# Patient Record
Sex: Female | Born: 1955 | Race: White | Hispanic: No | Smoking: Never smoker
Health system: Southern US, Community
[De-identification: ages and names within clinical notes are randomized; demographics above are authoritative.]

## PROBLEM LIST (undated history)

## (undated) DIAGNOSIS — E119 Type 2 diabetes mellitus without complications: Secondary | ICD-10-CM

## (undated) DIAGNOSIS — R011 Cardiac murmur, unspecified: Secondary | ICD-10-CM

## (undated) DIAGNOSIS — D492 Neoplasm of unspecified behavior of bone, soft tissue, and skin: Secondary | ICD-10-CM

## (undated) DIAGNOSIS — H269 Unspecified cataract: Secondary | ICD-10-CM

## (undated) DIAGNOSIS — F329 Major depressive disorder, single episode, unspecified: Secondary | ICD-10-CM

## (undated) DIAGNOSIS — R569 Unspecified convulsions: Secondary | ICD-10-CM

## (undated) DIAGNOSIS — M940 Chondrocostal junction syndrome [Tietze]: Secondary | ICD-10-CM

## (undated) DIAGNOSIS — I2699 Other pulmonary embolism without acute cor pulmonale: Secondary | ICD-10-CM

## (undated) HISTORY — DX: Other pulmonary embolism without acute cor pulmonale: I26.99

## (undated) HISTORY — DX: Type 2 diabetes mellitus without complications: E11.9

## (undated) HISTORY — DX: Unspecified convulsions: R56.9

## (undated) HISTORY — DX: Major depressive disorder, single episode, unspecified: F32.9

## (undated) HISTORY — DX: Cardiac murmur, unspecified: R01.1

## (undated) HISTORY — PX: OTHER SURGICAL HISTORY: SHX169

## (undated) HISTORY — DX: Unspecified cataract: H26.9

## (undated) HISTORY — DX: Neoplasm of unspecified behavior of bone, soft tissue, and skin: D49.2

## (undated) HISTORY — DX: Chondrocostal junction syndrome (tietze): M94.0

---

## 1990-01-09 DIAGNOSIS — R569 Unspecified convulsions: Secondary | ICD-10-CM

## 1990-01-09 HISTORY — DX: Unspecified convulsions: R56.9

## 2001-01-09 HISTORY — PX: OTHER SURGICAL HISTORY: SHX169

## 2001-05-08 ENCOUNTER — Encounter: Payer: Self-pay | Admitting: Neurosurgery

## 2001-05-08 ENCOUNTER — Ambulatory Visit (HOSPITAL_COMMUNITY): Admission: RE | Admit: 2001-05-08 | Discharge: 2001-05-08 | Payer: Self-pay | Admitting: Neurosurgery

## 2001-05-09 ENCOUNTER — Encounter: Payer: Self-pay | Admitting: Neurosurgery

## 2001-05-09 ENCOUNTER — Inpatient Hospital Stay (HOSPITAL_COMMUNITY): Admission: RE | Admit: 2001-05-09 | Discharge: 2001-05-16 | Payer: Self-pay | Admitting: Neurosurgery

## 2001-05-09 ENCOUNTER — Encounter (INDEPENDENT_AMBULATORY_CARE_PROVIDER_SITE_OTHER): Payer: Self-pay | Admitting: Specialist

## 2001-05-13 ENCOUNTER — Encounter: Payer: Self-pay | Admitting: Neurosurgery

## 2001-05-30 ENCOUNTER — Encounter: Payer: Self-pay | Admitting: Neurosurgery

## 2001-05-30 ENCOUNTER — Encounter: Admission: RE | Admit: 2001-05-30 | Discharge: 2001-05-30 | Payer: Self-pay | Admitting: Neurosurgery

## 2001-09-26 ENCOUNTER — Encounter: Admission: RE | Admit: 2001-09-26 | Discharge: 2001-09-26 | Payer: Self-pay | Admitting: Neurosurgery

## 2001-09-26 ENCOUNTER — Encounter: Payer: Self-pay | Admitting: Neurosurgery

## 2001-11-24 ENCOUNTER — Ambulatory Visit (HOSPITAL_COMMUNITY): Admission: RE | Admit: 2001-11-24 | Discharge: 2001-11-24 | Payer: Self-pay | Admitting: Neurosurgery

## 2001-11-24 ENCOUNTER — Encounter: Payer: Self-pay | Admitting: Neurosurgery

## 2001-12-09 ENCOUNTER — Encounter: Admission: RE | Admit: 2001-12-09 | Discharge: 2001-12-09 | Payer: Self-pay | Admitting: Neurosurgery

## 2001-12-09 ENCOUNTER — Encounter: Payer: Self-pay | Admitting: Neurosurgery

## 2002-02-10 ENCOUNTER — Encounter: Admission: RE | Admit: 2002-02-10 | Discharge: 2002-02-10 | Payer: Self-pay | Admitting: Neurosurgery

## 2002-02-10 ENCOUNTER — Encounter: Payer: Self-pay | Admitting: Neurosurgery

## 2002-04-11 ENCOUNTER — Encounter: Payer: Self-pay | Admitting: Neurosurgery

## 2002-04-11 ENCOUNTER — Inpatient Hospital Stay (HOSPITAL_COMMUNITY): Admission: RE | Admit: 2002-04-11 | Discharge: 2002-04-12 | Payer: Self-pay | Admitting: Neurosurgery

## 2004-10-06 ENCOUNTER — Ambulatory Visit: Payer: Self-pay | Admitting: Internal Medicine

## 2004-10-11 ENCOUNTER — Other Ambulatory Visit: Admission: RE | Admit: 2004-10-11 | Discharge: 2004-10-11 | Payer: Self-pay | Admitting: Internal Medicine

## 2004-10-11 ENCOUNTER — Encounter: Payer: Self-pay | Admitting: Internal Medicine

## 2004-10-11 ENCOUNTER — Ambulatory Visit: Payer: Self-pay | Admitting: Internal Medicine

## 2004-10-24 ENCOUNTER — Ambulatory Visit (HOSPITAL_COMMUNITY): Admission: RE | Admit: 2004-10-24 | Discharge: 2004-10-24 | Payer: Self-pay | Admitting: Internal Medicine

## 2004-12-15 ENCOUNTER — Inpatient Hospital Stay (HOSPITAL_COMMUNITY): Admission: RE | Admit: 2004-12-15 | Discharge: 2004-12-17 | Payer: Self-pay | Admitting: Obstetrics and Gynecology

## 2004-12-15 ENCOUNTER — Encounter (INDEPENDENT_AMBULATORY_CARE_PROVIDER_SITE_OTHER): Payer: Self-pay | Admitting: Specialist

## 2005-01-09 DIAGNOSIS — I2699 Other pulmonary embolism without acute cor pulmonale: Secondary | ICD-10-CM

## 2005-01-09 HISTORY — PX: ABDOMINAL HYSTERECTOMY: SHX81

## 2005-01-09 HISTORY — DX: Other pulmonary embolism without acute cor pulmonale: I26.99

## 2005-11-27 ENCOUNTER — Ambulatory Visit: Payer: Self-pay | Admitting: Cardiology

## 2005-11-27 ENCOUNTER — Ambulatory Visit: Payer: Self-pay | Admitting: Internal Medicine

## 2005-11-27 ENCOUNTER — Inpatient Hospital Stay (HOSPITAL_COMMUNITY): Admission: AD | Admit: 2005-11-27 | Discharge: 2005-12-02 | Payer: Self-pay | Admitting: Cardiology

## 2005-11-28 ENCOUNTER — Encounter: Payer: Self-pay | Admitting: Internal Medicine

## 2005-11-29 ENCOUNTER — Encounter: Payer: Self-pay | Admitting: Vascular Surgery

## 2005-12-04 ENCOUNTER — Ambulatory Visit: Payer: Self-pay | Admitting: Internal Medicine

## 2005-12-04 ENCOUNTER — Ambulatory Visit: Payer: Self-pay

## 2005-12-04 ENCOUNTER — Ambulatory Visit: Payer: Self-pay | Admitting: Cardiology

## 2005-12-06 ENCOUNTER — Ambulatory Visit: Payer: Self-pay | Admitting: Cardiovascular Disease

## 2005-12-14 ENCOUNTER — Ambulatory Visit: Payer: Self-pay | Admitting: Cardiology

## 2005-12-22 ENCOUNTER — Ambulatory Visit: Payer: Self-pay | Admitting: Internal Medicine

## 2005-12-26 ENCOUNTER — Ambulatory Visit: Payer: Self-pay | Admitting: Cardiology

## 2006-01-12 ENCOUNTER — Ambulatory Visit: Payer: Self-pay | Admitting: Cardiology

## 2006-02-09 ENCOUNTER — Ambulatory Visit: Payer: Self-pay | Admitting: Internal Medicine

## 2006-03-09 ENCOUNTER — Ambulatory Visit: Payer: Self-pay | Admitting: Internal Medicine

## 2006-04-06 ENCOUNTER — Ambulatory Visit: Payer: Self-pay | Admitting: Cardiology

## 2006-05-04 ENCOUNTER — Ambulatory Visit: Payer: Self-pay | Admitting: Cardiology

## 2006-08-09 ENCOUNTER — Encounter: Admission: RE | Admit: 2006-08-09 | Discharge: 2006-08-09 | Payer: Self-pay | Admitting: Internal Medicine

## 2006-08-09 LAB — HM MAMMOGRAPHY: HM Mammogram: NORMAL

## 2006-11-22 ENCOUNTER — Telehealth: Payer: Self-pay | Admitting: Internal Medicine

## 2006-11-22 ENCOUNTER — Ambulatory Visit: Payer: Self-pay | Admitting: Internal Medicine

## 2006-11-22 DIAGNOSIS — R079 Chest pain, unspecified: Secondary | ICD-10-CM | POA: Insufficient documentation

## 2006-11-23 LAB — CONVERTED CEMR LAB
Basophils Absolute: 0 10*3/uL (ref 0.0–0.1)
Basophils Relative: 0 % (ref 0–1)
Eosinophils Relative: 0 % (ref 0–5)
Hemoglobin: 13.7 g/dL (ref 12.0–15.0)
Platelets: 258 10*3/uL (ref 150–400)
RBC: 4.43 M/uL (ref 3.87–5.11)
RDW: 13.6 % (ref 11.5–15.5)

## 2007-04-06 ENCOUNTER — Encounter: Payer: Self-pay | Admitting: *Deleted

## 2007-04-06 DIAGNOSIS — Z8659 Personal history of other mental and behavioral disorders: Secondary | ICD-10-CM | POA: Insufficient documentation

## 2007-04-06 DIAGNOSIS — Z8669 Personal history of other diseases of the nervous system and sense organs: Secondary | ICD-10-CM | POA: Insufficient documentation

## 2007-04-06 DIAGNOSIS — M940 Chondrocostal junction syndrome [Tietze]: Secondary | ICD-10-CM | POA: Insufficient documentation

## 2007-12-19 ENCOUNTER — Ambulatory Visit: Payer: Self-pay | Admitting: Internal Medicine

## 2007-12-19 DIAGNOSIS — M542 Cervicalgia: Secondary | ICD-10-CM | POA: Insufficient documentation

## 2007-12-19 DIAGNOSIS — R5381 Other malaise: Secondary | ICD-10-CM | POA: Insufficient documentation

## 2007-12-19 DIAGNOSIS — R5383 Other fatigue: Secondary | ICD-10-CM

## 2007-12-19 LAB — CONVERTED CEMR LAB
Basophils Relative: 2.3 % (ref 0.0–3.0)
Creatinine, Ser: 0.7 mg/dL (ref 0.4–1.2)
Eosinophils Absolute: 0.1 10*3/uL (ref 0.0–0.7)
Eosinophils Relative: 1.9 % (ref 0.0–5.0)
GFR calc Af Amer: 113 mL/min
GFR calc non Af Amer: 93 mL/min
Glucose, Bld: 100 mg/dL — ABNORMAL HIGH (ref 70–99)
Hemoglobin: 14.8 g/dL (ref 12.0–15.0)
Lymphocytes Relative: 28.6 % (ref 12.0–46.0)
MCHC: 35.3 g/dL (ref 30.0–36.0)
MCV: 89.6 fL (ref 78.0–100.0)
Monocytes Absolute: 0.5 10*3/uL (ref 0.1–1.0)
Monocytes Relative: 6.7 % (ref 3.0–12.0)
Sodium: 140 meq/L (ref 135–145)
Total CK: 96 units/L (ref 7–177)
WBC: 6.9 10*3/uL (ref 4.5–10.5)

## 2007-12-21 ENCOUNTER — Encounter: Payer: Self-pay | Admitting: Internal Medicine

## 2008-04-16 ENCOUNTER — Telehealth: Payer: Self-pay | Admitting: Internal Medicine

## 2008-04-21 ENCOUNTER — Ambulatory Visit: Payer: Self-pay | Admitting: Internal Medicine

## 2008-04-22 ENCOUNTER — Encounter: Payer: Self-pay | Admitting: Internal Medicine

## 2008-04-22 ENCOUNTER — Telehealth: Payer: Self-pay | Admitting: Internal Medicine

## 2008-06-01 ENCOUNTER — Telehealth: Payer: Self-pay | Admitting: Internal Medicine

## 2008-06-21 ENCOUNTER — Telehealth: Payer: Self-pay | Admitting: Family Medicine

## 2008-06-22 ENCOUNTER — Ambulatory Visit: Payer: Self-pay | Admitting: Internal Medicine

## 2008-06-22 ENCOUNTER — Telehealth: Payer: Self-pay | Admitting: Internal Medicine

## 2008-06-24 ENCOUNTER — Telehealth: Payer: Self-pay | Admitting: Internal Medicine

## 2008-06-25 ENCOUNTER — Encounter: Payer: Self-pay | Admitting: Internal Medicine

## 2008-11-25 ENCOUNTER — Telehealth (INDEPENDENT_AMBULATORY_CARE_PROVIDER_SITE_OTHER): Payer: Self-pay | Admitting: *Deleted

## 2008-11-26 ENCOUNTER — Encounter: Payer: Self-pay | Admitting: Internal Medicine

## 2009-05-16 ENCOUNTER — Emergency Department (HOSPITAL_COMMUNITY): Admission: EM | Admit: 2009-05-16 | Discharge: 2009-05-16 | Payer: Self-pay | Admitting: Emergency Medicine

## 2009-05-17 ENCOUNTER — Ambulatory Visit: Payer: Self-pay | Admitting: Internal Medicine

## 2009-05-17 ENCOUNTER — Telehealth: Payer: Self-pay | Admitting: Internal Medicine

## 2009-05-18 ENCOUNTER — Telehealth: Payer: Self-pay | Admitting: Internal Medicine

## 2010-01-11 ENCOUNTER — Ambulatory Visit
Admission: RE | Admit: 2010-01-11 | Discharge: 2010-01-11 | Payer: Self-pay | Source: Home / Self Care | Attending: Internal Medicine | Admitting: Internal Medicine

## 2010-01-11 ENCOUNTER — Telehealth: Payer: Self-pay | Admitting: Internal Medicine

## 2010-01-29 ENCOUNTER — Encounter: Payer: Self-pay | Admitting: Neurosurgery

## 2010-02-08 NOTE — Progress Notes (Signed)
Summary: REFILLs  Phone Note Refill Request Call back at Home Phone [phone]   Refills Requested: Medication #1:  OXYCODONE-ACETAMINOPHEN 5-325 MG TABS 1 to 2 tabs q 6 hours as needed  Medication #2:  NAPROSYN 500 MG TABS 1 tab by mouth two times a day as needed  Medication #3:  DIAZEPAM 5 MG TABS 1 tab by mouth q 6 hours as needed Initial call taken by: Lamar Sprinkles, CMA,  May 18, 2009 9:12 AM  Follow-up for Phone Call        [facility] fo refills 1. oxycodone/APAP 5/325, 1 or 2 q 6 as needed back pain- 60, no refills 2. Diazepam 2mg  - 1-3 tabs q 6 as needed muscle spasm. 60, no refills 3.  May use otc naproxen sodium 220mg  2 tabs two times a day.  Follow-up by: Jacques Navy MD,  May 18, 2009 5:23 PM    New/Updated Medications: DIAZEPAM 2 MG TABS (DIAZEPAM) 1 to 3 q 6 hours as needed muscle spasm Prescriptions: DIAZEPAM 2 MG TABS (DIAZEPAM) 1 to 3 q 6 hours as needed muscle spasm  #60 x 0   Entered by:   Lamar Sprinkles, CMA   Authorized by:   Jacques Navy MD   Signed by:   Lamar Sprinkles, CMA on 05/18/2009   Method used:   Telephoned to ...       [facility] # [address]* (retail)       [address]       [place], [place]  [address]       Ph: [phone]       Fax: [phone]   RxID:   [id number] OXYCODONE-ACETAMINOPHEN 5-325 MG TABS (OXYCODONE-ACETAMINOPHEN) 1 to 2 tabs q 6 hours as needed  #60 x 0   Entered by:   Lamar Sprinkles, CMA   Authorized by:   Jacques Navy MD   Signed by:   Lamar Sprinkles, CMA on 05/18/2009   Method used:   Print then Give to Patient   RxID:   [id number]

## 2010-02-08 NOTE — Assessment & Plan Note (Signed)
Summary: unbearable pain in lower back/post er/lb   Vital Signs:  Patient profile:   55 year old female Height:      65 inches Weight:      175 pounds BMI:     29.23 O2 Sat:      95 % on Room air Pulse rate:   87 / minute BP sitting:   136 / 88  (left arm) Cuff size:   regular  Vitals Entered By: Bill Salinas CMA (May 17, 2009 11:59 AM)  O2 Flow:  Room air CC: pt here for evaluation of lower back pain/ went over pt's allergies and she states that she is not allergic to percocet/ ab   Primary Care Provider:  Deanne Bedgood  CC:  pt here for evaluation of lower back pain/ went over pt's allergies and she states that she is not allergic to percocet/ ab.  History of Present Illness: Patient presents for follow-up of low back pain. This began Thursday, May 5th. she was seen in [facility] May 8th for this pain. All ED records reviewed: she had a normal exam; normal L-S spine films; normal U/A. She was sent home on percocet 5/325 1-2 q 6; Valium 5mg  q 6 and naprosyn 500mg  q 6. She reports that the pain is worse. She denies any loss bladder or bowel function over the past 5 days; no paresthesia in the legs but endorses weakness. She rates the pain as a 7-10/10 that is only relieved for 2 hrs with the pain medication. She has not been taking the naprosyn due to fear of gastric irritation.   Current Medications (verified): 1)  Oxycodone-Acetaminophen 5-325 Mg Tabs (Oxycodone-Acetaminophen) .Marland Kitchen.. 1 To 2 Tabs Q 6 Hours As Needed 2)  Naprosyn 500 Mg Tabs (Naproxen) .Marland Kitchen.. 1 Tab By Mouth Two Times A Day As Needed 3)  Diazepam 5 Mg Tabs (Diazepam) .Marland Kitchen.. 1 Tab By Mouth Q 6 Hours As Needed 4)  Tylenol 325 Mg Tabs (Acetaminophen) .... As Needed  Allergies: 1)  ! Percocet 2)  ! Cortisone  Past History:  Past Medical History: Last updated: 09-Jan-2008 SEIZURES, HX OF (ICD-V12.49) DEPRESSION, SITUATIONAL, HX OF (ICD-V11.8) HEART MURMUR, HX OF BIRTH DEFECT (ICD-V12.50) * Hx of SURGICAL REPAIR FOR DISPLACED ROD  FIXATION '03 * Hx of REMOVAL OF BENIGN TUMOR CERVICAL SPINE '03 CAESAREAN SECTION, HX OF (ICD-V39.01) '82 COSTOCHONDRITIS (ICD-733.6) PULMONARY EMBOLISM SMALL (ICD-415.19) '07 CHEST PAIN (ICD-786.50)-negative eval by cath Nov '07      Past Surgical History: Last updated: 01-09-08 * Hx of SURGICAL REPAIR FOR DISPLACED ROD FIXATION * Hx of REMOVAL OF BENIGN TUMOR CERVICAL SPINE CAESAREAN SECTION, HX OF (ICD-V39.01) Hysterectomy '07  Family History: Last updated: 09-Jan-2008 father - deceased @ 31: CVA, HTN, lipid, CAD, DM mother - 22: excellent health Neg-  PAunt - breast cancer, colon cancer Brother deceased @ 90: MI Brother deceased @ 27: DM with severe complications including renal failure, amputation  Social History: Last updated: 09-Jan-2008 Rachelle Hora - [facility] married '77 2 daughters - '82, '85; 1 son - '91 work: Physicist, medical.  Risk Factors: Caffeine Use: 1 (Jan 09, 2008) Exercise: no (01/09/08)  Risk Factors: Smoking Status: never (04/06/2007)  Review of Systems       The patient complains of muscle weakness.  The patient denies anorexia, fever, weight loss, weight gain, hoarseness, chest pain, dyspnea on exertion, peripheral edema, headaches, abdominal pain, hematochezia, suspicious skin lesions, transient blindness, depression, unusual weight change, enlarged lymph nodes, and breast masses.    Physical Exam  General:  overweight white female who is uncomfortable but not in distress Head:  normocephalic and atraumatic.   Eyes:  corneas and lenses clear and no injection.   Lungs:  normal respiratory effort and normal breath sounds.   Heart:  normal rate and regular rhythm.   Msk:  back exam: nl stand, flex to 40 degrees before limited by pain, nl gait, nl toe/heel walk, able to step-up to exam table without assistance, nl SLR sitting, SLR limited to 30 degrees supine, nl DTR's patellar tendon, tender to percussion at lower lumbar spine with paravertebral  tenderness to palpation. Pulses:  2+ radial Neurologic:  alert & oriented X3 and cranial nerves II-XII intact.   Skin:  turgor normal and color normal.   Psych:  Oriented X3, memory intact for recent and remote, and normally interactive.     Impression & Recommendations:  Problem # 1:  LOW BACK PAIN, ACUTE (ICD-724.2) atient with low back pain. ED evaluation negative. Exam with no radicular signs but low back pain with limitation in movement. Suspect low back strain.  Plan - continue with oxycodone/APAP, Diazepam and Naprosyn           use of heat           out of work until back pain is better           for increased pain or radicular symptoms wil move ahead with MRI.  Her updated medication list for this problem includes:    Oxycodone-acetaminophen 5-325 Mg Tabs (Oxycodone-acetaminophen) .Marland Kitchen... 1 to 2 tabs q 6 hours as needed    Naprosyn 500 Mg Tabs (Naproxen) .Marland Kitchen... 1 tab by mouth two times a day as needed    Tylenol 325 Mg Tabs (Acetaminophen) .Marland Kitchen... As needed  Complete Medication List: 1)  Oxycodone-acetaminophen 5-325 Mg Tabs (Oxycodone-acetaminophen) .Marland Kitchen.. 1 to 2 tabs q 6 hours as needed 2)  Naprosyn 500 Mg Tabs (Naproxen) .Marland Kitchen.. 1 tab by mouth two times a day as needed 3)  Diazepam 5 Mg Tabs (Diazepam) .Marland Kitchen.. 1 tab by mouth q 6 hours as needed 4)  Tylenol 325 Mg Tabs (Acetaminophen) .... As needed

## 2010-02-08 NOTE — Progress Notes (Signed)
Summary: Call Report  Phone Note Other Incoming   Caller: [facility] Call Report Summary of Call: [facility] Triage Call Report Triage Record Num: [id number] Operator: Arline Asp Loftin Patient Name: Tara [name] Call Date & Time: 05/16/2009 8:23:54AM Patient Phone: [phone] PCP: Illene Regulus Patient Gender: Female PCP Fax : [phone] Patient DOB: 01-18-1955 Practice Name: [facility] Reason for Call: Pt with twinge/pain onset 05/05 to back and hips. Pain has progressively worsened, and is now "shooting into my legs." Difficulty standing upright, and c/o weakness to both legs. Advised to be seen for immediate evaluation, and recommended [facility]. Protocol(s) Used: Back Symptoms Recommended Outcome per Protocol: See ED Immediately Reason for Outcome: New onset back pain associated with numbness or weakness in both legs Care Advice:  ~ Do not give the patient anything to eat or drink. 05/16/2009 8:34:33AM Page 1 of 1 [id number] Initial call taken by: Denishia Pyle, CMA,  May 17, 2009 9:07 AM

## 2010-02-10 NOTE — Progress Notes (Signed)
  Phone Note Call from Patient Call back at Home Phone [phone]   Caller: Patient Call For: Jacques Navy MD Summary of Call: Pt has appt at 9:30am today. Pt has very sore throat,cough.mucous. Pt states if Dr will call in meds she will cancel appt. Please advise. Initial call taken by: Verdell Face,  January 11, 2010 8:14 AM  Follow-up for Phone Call        pt here for appt Follow-up by: Ami Bullins CMA,  January 11, 2010 9:29 AM

## 2010-02-10 NOTE — Assessment & Plan Note (Signed)
Summary: very sore throat/cough/mucous/cd   Vital Signs:  Patient profile:   55 year old female Height:      65 inches Weight:      175 pounds BMI:     29.23 O2 Sat:      96 % on Room air Temp:     97.8 degrees F oral Pulse rate:   76 / minute BP sitting:   140 / 90  (left arm) Cuff size:   regular  Vitals Entered By: Bill Salinas CMA (January 11, 2010 9:36 AM)  O2 Flow:  Room air CC: pt here with c/o sore throat x 4 days. she also states that she has been coughing up green mucous/ ab   Primary Care Provider:  Jaunice Mirza  CC:  pt here with c/o sore throat x 4 days. she also states that she has been coughing up green mucous/ ab.  History of Present Illness: Patient is seen for a 4 day h/o cough and sore throat. She reports having sore glands in the upper neck, denies having fever, nausea or vomiting. She has self-medicated with APAP, Zinc /w ecchinacea and tumeric, hydration. She is preparing to travel to [place] and is concerned about being infectious.  Current Medications (verified): 1)  Tylenol 325 Mg Tabs (Acetaminophen) .... As Needed  Allergies (verified): 1)  ! Percocet 2)  ! Cortisone  Past History:  Past Medical History: Last updated: 12/19/2007 SEIZURES, HX OF (ICD-V12.49) DEPRESSION, SITUATIONAL, HX OF (ICD-V11.8) HEART MURMUR, HX OF BIRTH DEFECT (ICD-V12.50) * Hx of SURGICAL REPAIR FOR DISPLACED ROD FIXATION '03 * Hx of REMOVAL OF BENIGN TUMOR CERVICAL SPINE '03 CAESAREAN SECTION, HX OF (ICD-V39.01) '82 COSTOCHONDRITIS (ICD-733.6) PULMONARY EMBOLISM SMALL (ICD-415.19) '07 CHEST PAIN (ICD-786.50)-negative eval by cath Nov '07      Past Surgical History: Last updated: 12/19/2007 * Hx of SURGICAL REPAIR FOR DISPLACED ROD FIXATION * Hx of REMOVAL OF BENIGN TUMOR CERVICAL SPINE CAESAREAN SECTION, HX OF (ICD-V39.01) Hysterectomy '07 FH reviewed for relevance, SH/Risk Factors reviewed for relevance  Review of Systems       The patient complains of  hoarseness and prolonged cough.  The patient denies anorexia, fever, weight loss, weight gain, chest pain, syncope, dyspnea on exertion, headaches, abdominal pain, severe indigestion/heartburn, and enlarged lymph nodes.    Physical Exam  General:  Well-developed,well-nourished,in no acute distress; alert,appropriate and cooperative throughout examination Head:  normocephalic and atraumatic.   Eyes:  pupils equal, pupils round, corneas and lenses clear, and no injection.   Ears:  TMS normal Nose:  nares erythematous with area of mucus at the septum Mouth:  no buccal or palatal lesions, posterior pharynx clear, exudate noted in the tonsilar crypts Neck:  supple, full ROM, and no masses.   Chest Wall:  no deformities.   Lungs:  normal respiratory effort and normal breath sounds.   Heart:  normal rate and regular rhythm.   Msk:  no joint tenderness, no joint swelling, and no joint warmth.   Pulses:  2+ radial Neurologic:  alert & oriented X3, cranial nerves II-XII intact, and gait normal.   Skin:  turgor normal, color normal, and no rashes.   Cervical Nodes:  no anterior cervical adenopathy and no posterior cervical adenopathy.   Psych:  Oriented X3, normally interactive, and good eye contact.     Impression & Recommendations:  Problem # 1:  PHARYNGITIS-ACUTE (ICD-462) Patient with tonsiltis/pharyngitis with exudate in the tonsilar crypts. Strep screen negative.  Plan - amox 875 mg two times  a day x 7           continue supportive care.   The following medications were removed from the medication list:    Naprosyn 500 Mg Tabs (Naproxen) .Marland Kitchen... 1 tab by mouth two times a day as needed Her updated medication list for this problem includes:    Tylenol 325 Mg Tabs (Acetaminophen) .Marland Kitchen... As needed    Amoxicillin 875 Mg Tabs (Amoxicillin) .Marland Kitchen... 1 by mouth two times a day x 7  Complete Medication List: 1)  Tylenol 325 Mg Tabs (Acetaminophen) .... As needed 2)  Amoxicillin 875 Mg Tabs  (Amoxicillin) .Marland Kitchen.. 1 by mouth two times a day x 7 Prescriptions: AMOXICILLIN 875 MG TABS (AMOXICILLIN) 1 by mouth two times a day x 7  #14 x 0   Entered and Authorized by:   Jacques Navy MD   Signed by:   Jacques Navy MD on 01/11/2010   Method used:   Electronically to        [facility] # [phone]* (retail)       [address]       [place], [place]  [address]       Ph: [phone]       Fax: [phone]   RxID:   [id number]    Orders Added: 1)  Est. Patient Level III [27253]    Laboratory Results   Date/Time Reported: Ami Bullins CMA  January 11, 2010 10:13 AM   Other Tests  Rapid Strep: negative

## 2010-03-29 LAB — URINALYSIS, ROUTINE W REFLEX MICROSCOPIC
Bilirubin Urine: NEGATIVE
Glucose, UA: NEGATIVE mg/dL
Hgb urine dipstick: NEGATIVE
Nitrite: NEGATIVE
Protein, ur: NEGATIVE mg/dL
Urobilinogen, UA: 0.2 mg/dL (ref 0.0–1.0)

## 2010-05-27 NOTE — Discharge Summary (Signed)
NAMESHARAE, ZAPPULLA            ACCOUNT NO.:  [id number]   MEDICAL RECORD NO.:  [id number]          PATIENT TYPE:  INP   LOCATION:  9310                          FACILITY:  [facility]   PHYSICIAN:  Carrington Clamp, M.D. DATE OF BIRTH:  01/05/1956   DATE OF ADMISSION:  12/15/2004  DATE OF DISCHARGE:  12/17/2004                                 DISCHARGE SUMMARY   ADMITTING DIAGNOSIS:  Menometrorrhagia secondary to symptomatic fibroid  uterus.   DISCHARGE DIAGNOSIS:  Menometrorrhagia secondary to symptomatic fibroid  uterus.   PERTINENT PROCEDURES PERFORMED:  A total abdominal hysterectomy.   PERTINENT TEST RESULTS:  A postop H&H of 10.5 and 30.9.   HISTORY AND PHYSICAL:  Please refer to the dictated history and physical on  chart, but briefly, this is a 55 year old with regular periods and  menometrorrhagia secondary to symptomatic fibroid uterus.   HOSPITAL COURSE:  Ms. Saathoff underwent the above named procedure on  December 15, 2004 without complications.  By postop day #2, she was eating,  ambulating, voiding and discharged with the following:  MEDICINES:  1.  Percocet 5 mg one p.o. q.4-6h. p.r.n. pain.  2.  Colace over the counter.  3.  Motrin 800 mg q.8h.  4.  Phenergan 25 mg one p.o. q.4-6h. p.r.n.  FOLLOW UP:  The patient is to return to Dr. Henderson Cloud for staple removal the  following Monday.  ACTIVITY:  No driving for two weeks, no lifting for six weeks, no sexually  active for six weeks and may shower.  DIET:  High fiber, high water.   The patient was discharged without complications.      Carrington Clamp, M.D.  Electronically Signed     MH/MEDQ  D:  02/02/2005  T:  02/02/2005  Job:  [id number]

## 2010-05-27 NOTE — H&P (Signed)
Tara [name], Tara [name]                      ACCOUNT NO.:  [id number]   MEDICAL RECORD NO.:  [id number]                   PATIENT TYPE:  INP   LOCATION:  3008                                 FACILITY:  [facility]   PHYSICIAN:  Hilda Lias, M.D.                DATE OF BIRTH:  May 09, 1955   DATE OF ADMISSION:  04/11/2002  DATE OF DISCHARGE:  04/12/2002                                HISTORY & PHYSICAL   HISTORY AND PHYSICAL:  The patient is a lady who underwent a posterior fossa  resection of neurofibroma by Dr. Wynetta Emery a year ago.  The tumor was completely  resected and the patient remained in the hospital for several weeks because  of post-therapy pain.  Patient at that time had a posterior fusion.  She had  been doing fairly well but she had been complaining of some neck pain.  She  had an x-ray which showed the one of the lateral mass screws was loose but,  nevertheless, by x-ray it was found that she has a subluxation between C3-C4  with a kyphosis between C4-C5.  Dr. Wynetta Emery advised her to have surgery and she  came to [place] for a second opinion.  I fully agree with Dr. Wynetta Emery and I referred  her back to him.  Nevertheless she wants [place] to take over her care.  This is  the main reason for her to be admitted today for surgery.   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   ALLERGIES:  Patient is not allergic to any medication.   SOCIAL HISTORY:  Negative.   FAMILY HISTORY:  There is a history of diabetes and stroke in her family.   REVIEW OF SYMPTOMS:  Positive for shortness of breath, neck pain.   MEDICATIONS:  She is taking Percocet 5 mg, between 6-8 a day.   PHYSICAL EXAMINATION:  HEENT:  Normal.  NECK:  There is a posterior scar, well healed.  She has some tenderness in  the paracervical muscles .  She is able to flex and extend although there is  positive discomfort with pain going to the shoulder.  LUNGS:  Clear.  HEART:  Heart  sounds normal.  EXTREMITIES:  Normal, pulses.  NEURO:  Mental status normal.  Cranial nerves normal.  Strength is 5/5 in  the upper and lower extremities.  Sensation is normal.  Reflexes normal.  Coordination normal.   The MRI showed that she had a complete resection of the tumor.  She has  kyphosis between C4-C5.   The cervical spine x-rays show that she has a step-off between C3-C4 which  gets worse with flexion.   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    RECOMMENDATIONS:  The patient is going to be admitted for a C3-C4 and C4-C5  anterior cervical diskectomy and fusion.  She knows about the risks such as  no improvement whatsoever, continuation of the pain, need for further  treatment including pain clinic, paralysis, damage to the vocal cords,  damage to the esophagus, stroke.                                               Hilda Lias, M.D.    EB/MEDQ  D:  04/11/2002  T:  04/12/2002  Job:  [id number]

## 2010-05-27 NOTE — H&P (Signed)
NAMECAMREE, Tara [name]            ACCOUNT NO.:  [id number]   MEDICAL RECORD NO.:  [id number]          PATIENT TYPE:  INP   LOCATION:  4734                         FACILITY:  [facility]   PHYSICIAN:  Tara Morton. Riley Kill, MD, FACCDATE OF BIRTH:  [birth year]   DATE OF ADMISSION:  11/27/2005  DATE OF DISCHARGE:                              HISTORY & PHYSICAL   CHIEF COMPLAINT:  Chest tightness.   HISTORY OF PRESENT ILLNESS:  [name] is a 55 year old woman who saw  Dr. Ranell Patrick today.  She gives a one-month history of some tightness in  the chest.  She describes this as a tightness not necessarily related to  exertion but a pressure.  It radiates up into the jaw.  If she lays down  on her right side, it eventually goes away.  The symptoms have been  progressive over the past few weeks.  She has had profound fatigue and  also difficulty walking from her work at [employer] out to the  parking lot without getting profoundly winded.  She is perimenopausal,  but has been on no hormone replacement therapy and has had no swelling  in the legs.  She has been seeing Dr. Debby Bud for about a year, having  seen Dr. Janey Greaser for a long period before that.   Her past medical history is remarkable for spinal surgery.  She had a  displaced drive rod fracture and cadaveric bone fusion plate  stabilization from C3-5.  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  She says that she has had  a murmur since childhood.  There has been no hypertension or diabetes  noted.  She does note a murmur from childhood and has had frequent  headaches.  She is gravida 6, para 3 with 3 spontaneous abortions.   SOCIAL HISTORY:  She works as a Camera operator at [employer]  She has 3 children, ages 13, 20 and 63.  She has not been a  smoker.  She is married.   The family history is remarkable for mother who is 50.  Her father died  at 66 and had type 2  diabetes as well as CVA.  Her brother had type 2  diabetes and had a heart attack and 68 year old brother died of a  myocardial infarction.   On review of systems, HEENT is negative.  There is no obvious GI  problems.  Her stools have been regular.  She has had occasional  dysuria.  She has not had a fever but has had some mild cough at times.   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   The review of systems is otherwise negative.   PHYSICAL EXAMINATION:  She is an alert, oriented female in no distress.  The blood pressure is 110/80 with a pulse of 67.  HEENT EXAMINATION:  Was unremarkable.  There is no arcus.  Extraocular  muscles are intact, and pupils are equal to light and accommodation.  There are no carotid bruits noted.  There is  no thyromegaly noted.  The lungs are clear to auscultation and percussion.  The PMI is nondisplaced.  There is a normal first and second heart  sound.  There is a minimal systolic ejection murmur without gallops or  rubs.  The abdomen is soft without obvious hepatosplenomegaly.  There is no extremity edema noted.   Electrocardiogram demonstrates normal sinus rhythm and nonspecific T-  wave abnormalities.   IMPRESSION:  1. Chest discomfort of uncertain etiology with worrisome symptoms to      suggest unstable angina pectoris.  2. Prior surgical replacement of a displaced rod in C3-C5.  3. Removal of benign tumor from the cervical spine  4. Cesarean section 1982.  5. History of murmur without significant murmur on examination today.   PLAN:  This is a difficult situation.  The symptoms have been  progressive, and she can no longer walk to her car without difficulty.  We plan to admit her to the hospital.  Chest x-ray will be obtained as  well as a D-dimer and the usual routine laboratories including thyroid  studies.  We will tentatively place her for an echocardiogram tomorrow,  and I believe a cardiac catheterization will be  scheduled for tomorrow  afternoon.  Risks, benefits and alternatives have been discussed with  the patient.  She is agreeable to proceed.  We will make appropriate  disposition based on this information.      Tara Morton. Riley Kill, MD, [facility]  Electronically Signed     TDS/MEDQ  D:  11/27/2005  T:  11/27/2005  Job:  [id number]

## 2010-05-27 NOTE — Discharge Summary (Signed)
[place]  Patient:    Tara [name], Tara [name] Visit Number: [id number] MRN: [id number]          Service Type: SUR Location: [id number] Attending Physician:  Mariam Dollar Dictated by:   Garlon Hatchet., M.D. 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  PROCEDURES:  Decompressive cervical laminectomy and resection of C4 nerve root schwannoma and posterior cervical fusion using vertex lateral mask system system.  SURGEON: Dr. Donalee Citrin  HISTORY OF PRESENT ILLNESS:  The patient was admitted as an EME and went to the operating room for above named procedure.  The patient did very well and went to recovery room and then the ICU.  Over the first night in the ICU the patient did very well.  Neurologically he had significant increase in function of the left side of her body.  However, she had severe neck spasm and neck pain.  The patient received narcotics to which the patient tolerated extremely well.  The patients pain was stabilized and she was able to transfer to the floor.  On the floor she continued to have a lot of pain issues with her neck and ambulation.  Physical and occupational therapy worked with her, however, the patient was very slow to initiate her own ambulation secondary to her neck pain.  Over the next several days this became increasingly more tolerable and she was ambulating.  The left side of her arm and leg returned to virtually normal strength and normal sensation.  Her wound remained clean and dry.  Her postoperative films showed excellent alignment and position of the screws and rods.  As the patient progressed with physical therapy she was finally stable and could be discharged home.  DISCHARGE MEDICATIONS: 1. Percocet. 2. Robaxin. 3. OxyContin.  FOLLOWUP:  The patient was scheduled to follow up in 1 week.  DISPOSITION:  At  the time of discharge the patient is neurologically stable. Dictated by:   Garlon Hatchet., M.D. Attending Physician:  Mariam Dollar DD:  06/06/01 TD:  06/06/01 Job: [id number]

## 2010-05-27 NOTE — Op Note (Signed)
Tara [name], [name]            ACCOUNT NO.:  [id number]   MEDICAL RECORD NO.:  [id number]          PATIENT TYPE:  INP   LOCATION:  9399                          FACILITY:  [facility]   PHYSICIAN:  Carrington Clamp, M.D. DATE OF BIRTH:  1955-04-09   DATE OF PROCEDURE:  12/15/2004  DATE OF DISCHARGE:                                 OPERATIVE REPORT   PREOPERATIVE DIAGNOSIS:  Dramatic fibroid uterus.   POSTOPERATIVE DIAGNOSIS:  Dramatic fibroid uterus.   PROCEDURE:  Total abdominal hysterectomy.   SURGEON:  Carrington Clamp, M.D.   ASSISTANT:  Luvenia Redden, M.D.   ANESTHESIA:  General.   SPECIMENS:  Uterus and cervix.   ESTIMATED BLOOD LOSS:  250 mL.   IV FLUIDS:  1400 mL.   URINE OUTPUT:  Not measured.   COMPLICATIONS:  None.   FINDINGS:  About a 14 weeks' size uterus, normal ovaries seen bilaterally.  Ureters were seen bilaterally and out of the field of dissection.   MEDICATIONS:  None.   COUNTS:  Correct x3.   TECHNIQUE:  After adequate general anesthesia achieved, the patient was  prepped and draped in the sterile fashion in dorsal supine position.  A  Pfannenstiel skin incision was made at the scalpel and carried down to  fascia with Bovie cautery. The fascia was incised in the midline with the  scalpel and carried down in a transverse curvilinear manner with the Mayo  scissors.  The fascia was reflected superiorly and inferiorly from the  rectus muscles.  The rectus muscle was split in midline.  A bowel-free  portion of the peritoneum was entered into carefully with Metzenbaum  scissors and then the peritoneum was incised in a superior and inferior  manner with good visualization of the bowel and bladder.   The O'Connor-O'Sullivan retractor was placed, and the bowel was packed away  with wet laps and the uterus grasped with a pair of Kelly clamps.  Each of  the round ligaments suture-transfixed with 0 Vicryl and then incised with  the Bovie cautery.  The Bovie  cautery was used also to incise the  vesicouterine fascia.  Blunt dissection and sharp dissection was used to  remove the bladder from the cervix.  An avascular portion of the posterior  broad ligament was entered into with the Bovie cautery and a pair of Heaneys  was placed over the uterine-ovarian ligaments.  Each pedicle was incised  with the Mayo scissors and secured with a freehand tie of 0 Vicryl followed  by a stitch of 0 Vicryl.  The uterines were skeletonized each and a pair of  Heaneys was placed on the uterine arteries at the level of the internal os.  Each pedicle was incised with the Mayo scissors and secured with a stitch of  0 Vicryl.  Alternating successive bites were taken with the straight Heaney  clamp.  Each pedicle was incised with the scalpel and then secured a stitch  of 0 Vicryl, best dissecting the cardinal ligament.  At the level of the  reflection of the vagina onto the cervix, a pair of curved Heaneys were  placed  and each pedicle was incised with the Mayo scissors until the entry  into the vagina.  Each angle was then secured with a Heaney stitch of 0  Vicryl and the uterine specimen was then amputated with the Jorgenson  scissors.  The cuff was then closed with interrupted figure-of-eight  stitches of 0 Vicryl.  Irrigation was performed and all parts of the pelvis  was found to be hemostatic.  All instruments were withdrawn from the abdomen  and the peritoneum was closed with a running stitch of 2-0 Vicryl, which  then included the muscle as a separate layer.  The muscle was irrigated and  rendered hemostatic with Bovie cautery.  Zero looped PDS was used to close  the fascia in a running locked fashion.  The subcutaneous tissue was  rendered hemostatic with Bovie cautery and irrigation and then closed with  interrupted stitches of 2-0 plain gut.  The skin was closed with staples.      Carrington Clamp, M.D.  Electronically Signed     MH/MEDQ  D:   12/15/2004  T:  12/15/2004  Job:  [id number]

## 2010-05-27 NOTE — Cardiovascular Report (Signed)
NAMEAICHA, CLINGENPEEL            ACCOUNT NO.:  [id number]   MEDICAL RECORD NO.:  [id number]          PATIENT TYPE:  INP   LOCATION:  4734                         FACILITY:  [facility]   PHYSICIAN:  Veverly Fells. Excell Seltzer, MD  DATE OF BIRTH:  December 06, 1955   DATE OF PROCEDURE:  11/29/2005  DATE OF DISCHARGE:                              CARDIAC CATHETERIZATION   PROCEDURE:  Left heart catheterization, selective coronary angiography, left  ventricular angiography.   INDICATIONS:  Ms. Cashin is a 55 year old woman who presented with chest  pain and shortness of breath.  She really has marked symptoms with activity.  She underwent a CT scan of the chest with contrast to evaluate for pulmonary  embolus and she was found to have a small right upper lobe pulmonary  embolus.  She has been started on anticoagulation with heparin.  Because of  her dramatic symptoms that seem out of proportion to a small pulmonary  embolus, she was referred for cardiac catheterization to rule out  significant obstructive coronary artery disease.   Procedural details, risks and indications of procedure were explained to the  patient.  Informed consent was obtained.  The right groin was prepped,  draped and anesthetized with 1% lidocaine.  Using the modified Seldinger  technique a 6-French arterial sheath was placed in the right femoral artery.  Multiple angiographic views of the left and right coronary arteries were  taken using a JL-4 catheter for the left coronary artery and JR-4 catheter  for the right coronary artery.  Following selective coronary angiography an  angled pigtail catheter was inserted into the left ventricle and left  ventricular pressures were recorded.  A 30 degree right anterior oblique  left ventriculogram was performed.  Pullback across the aortic valve was  done.   FINDINGS:  Aortic pressure 134/80 with a mean of 104, left ventricular  pressure 132/4 with an end-diastolic pressure of 14.   Left main stem is angiographically normal.  It bifurcates into the LAD and  left circumflex.  The LAD is a small to medium size vessel that courses down  to the distal anterior wall but does not quite reach the apex.  It gives off  two diagonal branches.  There is no significant disease throughout the LAD  or diagonal branches.   Left circumflex is a medium caliber vessel.  It courses down the AV groove  and gives off one large marginal branch that divides into multiple vessels.  The true AV groove circumflex beyond the marginal branch is small.  There is  no significant disease in the left circumflex system.   Right coronary artery is dominant.  It gives off a posterior AV segment with  one posterolateral branch ending in a dominant PDA that branches into  multiple vessels.  There is no significant angiographic disease throughout  the right coronary artery system.   Left ventriculogram performed in the 30 degree RAO projection shows normal  wall motion with left ventricular ejection fraction of 55%.  There is no  mitral regurgitation.   ASSESSMENT:  1. Normal coronary arteries.  2. Normal left ventricular function.  PLAN:  I evaluated the patient's arteriotomy site for a closure device in  the setting of her need for anticoagulation.  Unfortunately, the access site  is right at the bifurcation and the patient's superficial femoral artery is  quite small.  The risk of closure device is not acceptable based on that.  Her sheath will be pulled.  Her ACT is currently less than 150 and it can be  pulled immediately.  She can resume anticoagulation 6 hours following her  sheath pull.  She will be started on Lovenox 1 mg/kg every 12 hours.  She  will start Coumadin tonight.  Depending on her clinical response, we will  consider discharge in the next 24-48 hours.      Veverly Fells. Excell Seltzer, MD  Electronically Signed     MDC/MEDQ  D:  11/29/2005  T:  11/29/2005  Job:  [id number]   cc:    Arturo Morton. Riley Kill, MD, [facility]  Rosalyn Gess. Norins, MD

## 2010-05-27 NOTE — Assessment & Plan Note (Signed)
[facility]                             PRIMARY CARE OFFICE NOTE   NAME:[name], Tara SONN                   MRN:          [id number]  DATE:12/04/2005                            DOB:          1955/04/11    Tara [name] was seen November 27, 2005 for exertional chest pain and  discomfort of a worrisome nature.  She was urgently referred to Dr. Bonnee Quin, who saw the patient that day.  She was subsequently admitted to the  hospital.  Her evaluation revealed she had a positive D-dimer, and CT  angiogram did reveal the patient to have a small right upper lobe pulmonary  embolus.  Lower extremity venous Dopplers were negative for DVT.  The  patient was treated with intravenous heparin.  Because she continued to have  significant chest pain disproportional to a small PE, she was taken to  cardiac catheterization by Dr. Tonny Bollman.  This study was unremarkable  with no obstructive cardiac disease noted on November 29, 2005.  Subsequent  to this, the patient was to be discharged home on Lovenox, but because of  insurance and cost reasons, she was continued on IV heparin until Saturday,  December 02, 2005, when she was discharged home on Lovenox.  She was also  started on Coumadin.  The patient returns today for followup and for a  return to work permit.   Since hospital discharge, the patient continues to have exertional chest  pain and discomfort, which is described as a substernal type pain, really  isolated to exertion.  She is not having pain or discomfort with deep  inspiration or arm movement.   I reviewed the laboratories from the hospitalization which showed the  patient had an antithrombin III level that was at the very low end of normal  at 75.  Protein S, protein C, Leiden factor V were all normal.   The patient was at the coag clinic today, and her INR was 1.9.   PHYSICAL EXAMINATION:  VITAL SIGNS:  Temperature was 97.8, blood  pressure  139/84, pulse 95.  GENERAL:  The patient is a well-nourished, well-developed woman which is in  no acute distress.  LUNGS:  Clear.  CARDIOVASCULAR:  Regular rate and rhythm.  CHEST WALL:  The patient had exquisite tenderness to palpation at the  costosternal junction of ribs 4, 5 and 6, right and left.   ASSESSMENT AND PLAN:  1. Pulmonary embolus.  The patient does have a small pulmonary embolus.  I      am curious as to whether her antithrombin III level is at a point where      this might have been a precipitating cause for pulmonary embolus;      however, I do not believe she required extraordinary length of      treatment, and suspect she is a candidate for 3-6 months of Coumadin.      With an INR of 1.9 at today's visit, it is safe to stop Lovenox.  The      patient will continue Coumadin as directed by Shelby Dubin and will  return to the coag clinic on Wednesday for a followup INR and      adjustment of medication.  2. Cardiovascular.  The patient has ruled out for coronary artery disease      with a non-obstructive cardiac cath.  No further evaluation is needed.  3. Costochondritis.  The patient has exquisite tenderness to palpation.  I      suspect this is the origin of some of her chest discomfort.  She is      unable to take nonsteroidal antiinflammatory medication while on      Coumadin.  She reports that she a migraine headache response to      steroids, and therefore prednisone is ruled out.  There are too many      points of tenderness for localized injection therapy.  Plan - the      patient can take Tylenol 650 mg four times a day as needed.  She can      use liniment of choice and/or apply heat.  I would recommend range of      motion exercise.  If she is interested in alternative medicine therapy,      she may want to inquire into acupuncture treatment for the inflammation      and discomfort or other treatment.   The patient is given a note to allow  her to return to work with no  limitations in activities.  She is to followup at the Coumadin clinic as  noted.     Tara Gess Norins, MD  Electronically Signed    MEN/MedQ  DD: 12/04/2005  DT: 12/05/2005  Job #: [id number]   cc:   Arturo Morton. Riley Kill, MD, [facility]  Coag Clinic

## 2010-05-27 NOTE — Op Note (Signed)
Tara [name], Tara [name]                      ACCOUNT NO.:  [id number]   MEDICAL RECORD NO.:  [id number]                   PATIENT TYPE:  INP   LOCATION:  3008                                 FACILITY:  [facility]   PHYSICIAN:  Hilda Lias, M.D.                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   POSTOPERATIVE DIAGNOSIS:  C3-C4, C4-C5 subluxation with kyphosis, status  post resection of neurofibroma  posteriorly at the level of C3-C4.   PROCEDURE:  Anterior C3-C4,  C4-C5 diskectomy, decompression of the spinal  cord, foraminotomy, bone graft iliac crest, plate for K0-X3, C-arm  microscope.   SURGEON:  Hilda Lias, M.D.   ASSISTANT:  Dr. Eliberto Ivory.   INDICATION:  The patient almost a year ago underwent C4-C5 posterior  cervical neurofibroma excision.  The patient did well but complained of neck  pain and required around 15-20 Percocet a day.  __________ was completely  normal.  She had subluxation between C3 and C4 as well as kyphosis between  C4 and C5.  Surgery was advised by the initial surgeon, but the patient  asked that I take over.  The risks of the surgery were fully explained to  her and her husband.   DESCRIPTION OF PROCEDURE:  The patient was taken to the OR, and after  intubation, the left side of the neck was prepped with Betadine.  With the  help of the C-arm, we localized the area between C3-C4 and C4-C5.  Transverse incision was made through the skin and the platysma, and we went  straight down to the cervical spine.  There were anterior osteophytes which  were removed.  We opened the anterior ligament at the level of C4-C5 and at  the level of C3-C4.  With the microscope, we did a total gross diskectomy  with removal  of the posterior ligament.  There was quite a bit of adhesions,  mostly in the right side.  Foraminotomy was accomplished with plenty of room  for the spinal cord but also for the level at C3-C4 and C4-C5.  The  endplates were drilled.  Then using an iliac crest bone graft, allograft, of  7 mm, we shaped it in a way that is was a little bit more narrow posterior-  anterior.  Insertion was done at the posterior level.  A plate from C3 to C5  using five screws was done.  With the fluoroscopy, we showed that there was  good position of the bone graft and the plate.  From there on, the area was  irrigated and closed with Vicryl and Steri-Strips.  Hilda Lias, M.D.    EB/MEDQ  D:  04/11/2002  T:  04/12/2002  Job:  [id number]

## 2010-05-27 NOTE — H&P (Signed)
Tara [name], Tara [name]            ACCOUNT NO.:  [id number]   MEDICAL RECORD NO.:  [id number]          PATIENT TYPE:  INP   LOCATION:  NA                            FAC[facility]   PHYSICIAN:  Carrington Clamp, M.D. DATE OF BIRTH:  08-15-1955   DATE OF ADMISSION:  12/15/2004  DATE OF DISCHARGE:                                HISTORY & PHYSICAL   CHIEF COMPLAINT:  This is a 55 year old with irregular periods and  menometrorrhagia secondary to a symptomatic fibroid uterus.   HISTORY OF PRESENT ILLNESS:  Ms. Hanback had been seen by me for the first  time on November 17, 2004, complaining with heavy hemorrhaging periods the  first three days of each period.  The patient complained of no  intermenstrual bleeding or pain with intercourse, but does have some  abdominal tenderness.  She complained that she goes through 2-3 tampons per  hour, leaking through pad, and has clots as well.  On examination and  ultrasound, she has about 15-week size uterus with multiple fibroids and the  patient desires definitive therapy.   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  She  has not been treated with medications.   PAST SURGICAL HISTORY:  The patient has had neck surgeries x2 and currently  has ruptured disks at C5, C6, and C7.   PAST OBSTETRICAL HISTORY:  1.  Cesarean section.  2.  VBAC.   PAST GYN HISTORY:  Negative for abnormal Pap smears or pelvic infections or  sexually transmitted disease.   MEDICATIONS:  None.   ALLERGIES:  No known drug allergies.   SOCIAL HISTORY:  The patient does not smoke.   PHYSICAL EXAMINATION:  VITAL SIGNS:  Blood pressure 120/80.  HEENT:  Anicteric and without lymphadenopathy.  HEART:  Regular rate and rhythm.  LUNGS:  Clear to auscultation bilaterally.  ABDOMEN:  Soft, nontender, and nondistended, however, a mass could be felt  about mid  hypogastric.  BREASTS:  Normal.  PELVIC:  Normal external genitalia, vault, and vagina and cervix.  The  uterus was about 15-16 weeks size, bulky, and filled the pelvis.  Adnexa  were not able to be palpated, but there were no masses. Bladder, anus, and  rectum were all normal.   The patient had recent blood work done with a TSH which was normal and an  H&H of 12.8.  After extensive discussion with the patient over the risks,  benefits, and alternatives of all the different possible therapies, the  patient  has elected to have a total abdominal hysterectomy.  The patient desires to  retain her ovaries as long as they look normal.  The patient understands the  risks, benefits, and alternatives of the procedure.  She will receive  intraoperative antibiotics and SED's.      Carrington Clamp, M.D.  Electronically Signed     MH/MEDQ  D:  12/15/2004  T:  12/15/2004  Job:  [id number]   cc:   Rosalyn Gess. Norins, M.D. [facility]  [address]  [place] [address]

## 2010-05-27 NOTE — Op Note (Signed)
[place]  Patient:    Tara [name], Tara [name] Visit Number: [id number] MRN: [id number]          Service Type: SUR Location: [id number] Attending Physician:  Mariam Dollar Dictated by:   Tara Hatchet., M.D. Proc. 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  POSTOPERATIVE DIAGNOSIS:  C4 ventral neurofibroma.  PROCEDURE:  Decompression cervical laminectomy from C2 to C5 with radical facetectomy at C3-C4 on the left, intradural and extradural resection of C4 neurofibroma left, duraplasty using 4 x 4 Duraguard bovine pericardial patch, posterior cervical fusion using the Vertex system from C2 to C5 with 4.0-mm diameter 14-mm screw, on the left at C5, and then C3, C4, and C5 on the right, using two 3.5 and two 4.0 14-mm screws.  Posterolateral arthrodesis using _______ bone substitute.  SURGEON:  Tara Hatchet., M.D.  ASSISTANT:  Reinaldo Meeker, M.D.  ANESTHESIA:  General endotracheal.  HISTORY OF PRESENT ILLNESS:  The patient is a very pleasant 55 year old female who has had six months of progressive worsening numbness in her left side in arm and left with progressive worsening of her gait and ataxia.  The patient also experienced left-sided numbness and loss of temperature sensation on the right side.  The patient had been referred to Dr. Anne Hahn and underwent brain and cervical spine MRI which showed a very large dumbbell-shaped lesion that homogeneously enhanced in the C4 nerve root extending in the canal and compressing the spinal cord.  This was felt to be consistent with a neurofibroma or schwannoma.  The patient was extensively counseled that with the degree of spinal cord compression, that this had to be removed, and with her underlying myelopathy, she was essentially explained the  risks and benefits of surgery to include but not limited to bleeding and infection, spinal fluid leak, spinal instability, spinal cord injury with paralysis, ventilator dependence, stroke, coma, death.  The patient understands these risks and agreed to proceed forward.  DESCRIPTION OF PROCEDURE:  The patient was brought into the OR, was induced in general anesthesia, positioned prone in [place] pins.  The backside of her neck was slightly flexed and prepped in routine sterile fashion.  Midline incision was made with a 10 blade scalpel.  Bovie electrocautery was used. subperiosteal dissection and subfascial dissection was carried out from the laminae of C2 to C5 bilaterally.  Self-retaining retractor was placed.  The intraoperative x-ray confirmed localization of the needle at the C3-C4 facet complex.  Laminotomy was begun by removing the spinous process with the Leksell rongeur, and then starting on the left side at the C4 lamina using a 2-mm Kerrison punch, the remainder of the C4 lamina was removed as well as the C3 lamina and part of the of the C2 lamina.  The laminae were thinned out and remodeled overlying a large epidural mass that was visualized immediately upon removing the lamina.  The laminotomy was continued across the midline to the right side taking care not to displace the ligamentum flavum or thecal sac, continuing to the lamina on the right side where the spinal cord was under a fair amount of compression.  The epidural mass was visualized and displacing the dura to the right side.  The entire  facet complex of C3-C4 was removed to expose the entirety of the mass extending out to the C4 neural foramina. Epidural veins were coagulated.  After the laminotomy was begun and the entire tumor was exposed, the operating microscope was draped, brought into the field, and under maximal illumination, the incision was made first over the nerve root and over the epidural tumor, and a  significant portion of this was debulked.  Then this was attempted to be teased away from the dura and dissected away; however, it was felt to be extending intradural at this point, and so the dura was opened with an 11 blade scalpel in a transverse fashion starting from the nerve root and extending back to approximately paramedian on the left.  Then the durotomy was extended cephalocaudal.  The dura was opened trying to maintain the arachnoid to be intact, and then the arachnoid was widely opened starting cephalad and then proceeding cautiously caudal.  The mass was visualized entering the epidural space underneath the dentate ligament.  It was felt to be coming from the ventral aspect of the C4 nerve root.  The dentate was dissected off the mass.  Then, under direct visualization of the intercanalicular component and the spinal cord component, the mass was continued to be dissected epidurally.  The tumor was radically debulked in the epidural space, and then the plane between the lateral extradural component and the capsule of the tumor was developed, and this was pulled laterally away from the canal to decompress the spinal cord.  A large part of this was performed this way, and then went intradurally and saw the ventral C4 nerve root that was extending into the tumor mass, and it was completely dysfunctional.  This was coagulated and divided, and this large section of dura was resected along with the tumor as it was completely eroded into the capsule, and it was difficult to delineate what was dura and what was tumor capsule.  So, this large section of dura was resected, and the tumor was seen to be dissected free.  As the ventral aspect of the tumor was dissected away, there was a large amount of epidural bleeding that was appreciated as well as large vessel bleeding coming off the ventral aspect of the tumor capsule.  It was unclear whether this was dense venous supply to the tumor  or possibly vertebral artery bleeding.  This was the location of the vertebral as noted on the MRA and the MRI; however, on the MRA, the vertebral was noted to  be occluded at approximately C2 distally.  So, it was felt that should the tumor be involved that this could be taken.  However, dissection was carried out in an attempt not to take it.  A couple of medium Hemoclips had to be placed overlying this mass of veins and/or vertebral artery, and this did arrest the bleeding.  The remainder of the tumor was debulked sequentially out the foramen, and the entirety of the tumor and capsule was felt to be resected at the end of the resection.  The spinal cord was completely decompressed, and there was no further tumor appreciated in the canal.  Gross inspection was carried out throughout the whole cavity, and the entirety of the neurofibroma and capsule appeared to be resected.  All epidural veins had been coagulated, and some had been packed off with Avitene and Gelfoam, and meticulous hemostasis was maintained.  Then, attention was taken to the duraplasty.  A piece of bovine pericardium was  cut, fashioned, and sewn using Nurolon and an attempt at a watertight closure was obtained; however, the inferior aspect and ventral aspect of the dura was very denuded and thinned out, and this was very difficult to attach to the durotomy; however, several sutures were placed to reapproximate.  Then a large section of muscle was resected and overlaid on top of the ventral and lateral aspects of the dura and anchored down.  Then, attention was taken to the fusion.  Pilot holes were drilled with a Midas-Rex in the inferomedial quadrant of both C2 and C5.  They were drilled, tapped, probed, and in the lateral mass of C2 which was felt to be fairly pronounced, a 14-mm 3.5 screw was inserted with excellent purchase.  If this had been dysfunctional, then a pedicle screw would have been placed at C2;  however, this screww had excellent purchase in the lateral complex at C2.  Then the scre was inserted at C5 in a similar fashion.  Then on the right side at C3, C4, and C5, lateral mass screws were also inserted.  Again, they were medial inferior quadrant, outer superior quadrant, drilled, tapped, probed, and 14-mm 3.5 screws were inserted.  A rescue screw had to be inserted at C5 bilaterally; however, all screws at the end had excellent purchase.  The rods were cut, fashioned, lordosed, and tightened down.  Then aggressive decortication was carried out within the facet complexes and overlying the cortical surface of the facet complexes and the laminae.  _______ bone substitute was packed between the facet complexes being mixed with the medullary bleeding, from the decortication, bilaterally.  Then the muscle had been overlaid on top on the durotomy.  Tisseal was then sprayed on top of the durotomy as well as the muscle.  Gelfoam was laid on top of this, and then the self-retaining retractor was replaced.  Postoperative x-ray confirmed good position of the plate, screws, and rods, and the fascia was closed with 0 interrupted Vicryl.  Subcutaneous tissue was closed with interrupted 2-0 Vicryl, and the skin was closed with a running nylon.  Postoperatively, all needle counts and sponge counts were correct. Dictated by:   Tara Hatchet., M.D. Attending Physician:  Mariam Dollar DD:  05/09/01 TD:  05/12/01 Job: [id number]

## 2010-05-27 NOTE — Discharge Summary (Signed)
Tara [name], Tara [name]            ACCOUNT NO.:  [id number]   MEDICAL RECORD NO.:  [id number]          PATIENT TYPE:  INP   LOCATION:  4734                         FACILITY:  [facility]   PHYSICIAN:  Veverly Fells. Excell Seltzer, MD  DATE OF BIRTH:  May 04, 1955   DATE OF ADMISSION:  11/27/2005  DATE OF DISCHARGE:  12/02/2005                                 DISCHARGE SUMMARY   PRIMARY CARDIOLOGIST:  Maisie Fus D. Riley Kill, MD, [facility] (new).   PRINCIPAL DIAGNOSES:  1. Noncardiac chest pain.      a.     Normal coronary arteries.      b.     Normal left ventricular function.  2. Small right upper lobe pulmonary embolus.      a.     Discharged on Coumadin/Lovenox overlap.   PROCEDURES:  1. Cardiac catheterization on November 21 by Dr. Bonnee Quin.  2. A 2-D echocardiogram on November 20.   REASON FOR ADMISSION:  Ms. Lansdowne is a 55 year old female, with no prior  cardiac history, who presented to Dr. Bonnee Quin with complaint of chest  pain worrisome for unstable angina pectoris.   HOSPITAL COURSE:  The patient ruled out for myocardial infarction with all  serial cardiac markers within normal limits.  D-dimer, however, was mildly  elevated (0.73), and she was referred for a CT scan of the chest to rule out  pulmonary embolus.  This revealed a small right upper lobe pulmonary  embolus.  Lower extremity venous Dopplers were negative for DVT.   The patient was treated with intravenous heparin.  However, she continued to  have significant chest pain and recommendation per Dr. Riley Kill was to  proceed with coronary angiography.   Cardiac catheterization, by Dr. Tonny Bollman, revealed normal coronary  arteries and normal left ventricular function.   Following this, the patient was placed on Lovenox and started on Coumadin.  This was monitored by pharmacy, and her INR at time of discharge was 1.5,  preceded by a reading of 1.3, after receiving two consecutive doses of  Coumadin 7.5 mg.   The patient was  cleared for discharge on hospital day #5, with arrangements  made for her to have home Lovenox.   DISCHARGE MEDICATIONS:  1. Coumadin 7.5 mg daily.  2. Lovenox 80 mg q.12 hours.  3. Tylox 5/325 mg 1-2 tablets q.6 hours p.r.n.   FOLLOWUP:  1. The patient will need to follow up with [facility] on      Monday, November 26.  Arrangements will be made through our office.  2. The patient will follow up with Dr. Shawnie Pons on December 18 at      9:45 a.m.  3. The patient is instructed to follow up with Dr. Illene Regulus, as      previously scheduled.   DISPOSITION:  Stable.      Gene Serpe, PA-C      Veverly Fells. Excell Seltzer, MD  Electronically Signed    GS/MEDQ  D:  12/02/2005  T:  12/02/2005  Job:  [id number]   cc:   Rosalyn Gess. Norins, MD

## 2010-05-27 NOTE — Assessment & Plan Note (Signed)
[facility]                            CARDIOLOGY OFFICE NOTE   NAME:Tara [name], Tara [name]                   MRN:          [id number]  DATE:12/26/2005                            DOB:          [birth year]    Ms. Osei is in for followup. To briefly summarize, she was admitted  to the hospital with some chest pain. The exact etiology was unclear.  Dr. Debby Bud thought that the symptoms could be cardiac in etiology. She  had an elevated D-dimer and a CT angio suggested a small right upper  lobe pulmonary embolus. Dopplers were negative. She had multiple  clotting studies done. She underwent cardiac catheterization, which did  not reveal critical coronary disease. The study for Factor V Leiden  mutation was negative. Protein S was within the normal range and protein  C also was within the normal range. Prothrombin gene mutation was also  negative.   In general, this lady has gotten along well. She has had some discomfort  in the stomach, which she relates to Coumadin and she would like to come  off of this as quickly as possible. She also saw Dr. Debby Bud back in  followup and she was clearly tender to palpation on examination. She is  improved. Please see his note of December 04, 2005.   Today, on physical, blood pressure is 118/92, the pulse is 55.  LUNG FIELDS: Are clear.  The cardiac rhythm is regular.  The groin is healed.   Overall, the etiology of her chest pain was probably not cardiac. Her  coronary angiogram was unremarkable. The CT suggested a pulmonary  embolus. The symptoms eventually became suggestive of musculoskeletal  pain. I will defer her care at this time to Dr. Debby Bud, and she will see  him back in followup. Whether or not she can come off of Coumadin at  this time is an issue. Preferably, she would be treated for a longer  period of time. I did review her CT scan directly with Dr. Chestine Spore, and  this was felt to be a pulmonary embolus.  Therefore, appropriate  treatment would be indicated.   I plan to see her back in follow up only on an as needed basis.     Arturo Morton. Riley Kill, MD, [facility]  Electronically Signed    TDS/MedQ  DD: 12/26/2005  DT: 12/26/2005  Job #: [id number]   cc:   Rosalyn Gess. Norins, MD

## 2010-10-03 ENCOUNTER — Telehealth: Payer: Self-pay | Admitting: *Deleted

## 2010-10-03 DIAGNOSIS — R358 Other polyuria: Secondary | ICD-10-CM

## 2010-10-03 DIAGNOSIS — M2550 Pain in unspecified joint: Secondary | ICD-10-CM

## 2010-10-03 DIAGNOSIS — R5381 Other malaise: Secondary | ICD-10-CM

## 2010-10-03 DIAGNOSIS — R5383 Other fatigue: Secondary | ICD-10-CM

## 2010-10-03 DIAGNOSIS — R609 Edema, unspecified: Secondary | ICD-10-CM

## 2010-10-03 DIAGNOSIS — R3589 Other polyuria: Secondary | ICD-10-CM

## 2010-10-03 DIAGNOSIS — R635 Abnormal weight gain: Secondary | ICD-10-CM

## 2010-10-03 NOTE — Telephone Encounter (Signed)
Patient requesting labs to check her thyroid. She is c/o weight gain.

## 2010-10-03 NOTE — Telephone Encounter (Signed)
Additional labs: A1C- polyuria , metabolic panel- weight gain, edema;  sed rate - joint pain

## 2010-10-03 NOTE — Telephone Encounter (Signed)
Spoke w/pt, She reports 50 lb weight gain over the last 6 mths w/little change in diet. She also c/o joint pains, swelling in hands and feet and very worried about possibly elevated blood sugar due to increase in urination. Ok for additional labs? She would like blood sugar checked, anything else?

## 2010-10-03 NOTE — Telephone Encounter (Signed)
OK for TSH, Free T4 for weight gain with ov to follow

## 2010-10-04 NOTE — Telephone Encounter (Signed)
Labs entered, pt already aware that they would be entered.

## 2010-10-05 ENCOUNTER — Other Ambulatory Visit (INDEPENDENT_AMBULATORY_CARE_PROVIDER_SITE_OTHER): Payer: Self-pay

## 2010-10-05 DIAGNOSIS — M2550 Pain in unspecified joint: Secondary | ICD-10-CM

## 2010-10-05 DIAGNOSIS — R635 Abnormal weight gain: Secondary | ICD-10-CM

## 2010-10-05 DIAGNOSIS — R5381 Other malaise: Secondary | ICD-10-CM

## 2010-10-05 DIAGNOSIS — R5383 Other fatigue: Secondary | ICD-10-CM

## 2010-10-05 LAB — BASIC METABOLIC PANEL
Calcium: 9.3 mg/dL (ref 8.4–10.5)
Creatinine, Ser: 0.8 mg/dL (ref 0.4–1.2)
GFR: 83.88 mL/min (ref >60.00)
Glucose, Bld: 115 mg/dL — ABNORMAL HIGH (ref 70–99)

## 2010-10-05 LAB — TSH: TSH: 1.46 u[IU]/mL (ref 0.35–5.50)

## 2010-10-07 ENCOUNTER — Encounter: Payer: Self-pay | Admitting: Internal Medicine

## 2010-10-08 ENCOUNTER — Encounter: Payer: Self-pay | Admitting: Internal Medicine

## 2010-10-10 ENCOUNTER — Ambulatory Visit (INDEPENDENT_AMBULATORY_CARE_PROVIDER_SITE_OTHER): Payer: Federal, State, Local not specified - PPO | Admitting: Internal Medicine

## 2010-10-10 ENCOUNTER — Encounter: Payer: Self-pay | Admitting: Internal Medicine

## 2010-10-10 DIAGNOSIS — R635 Abnormal weight gain: Secondary | ICD-10-CM

## 2010-10-10 NOTE — Patient Instructions (Signed)
Weight gain - 16 kg increase from last weight. Thyroid functions was normal, blood sugar was normal, including a three month look using the A1C. Kidney function and electrolytes were normal.  Plan - keep a detailed calorie count: everything that goes in for 5 days: throw away days 1 & 2, count the calories days 3-5. Target calories to maintain body weight of 79 kg is approximately 2,000 cal per day.

## 2010-10-11 DIAGNOSIS — R635 Abnormal weight gain: Secondary | ICD-10-CM | POA: Insufficient documentation

## 2010-10-11 NOTE — Assessment & Plan Note (Signed)
Wt Readings from Last 3 Encounters:  10/10/10 209 lb 14.1 oz (95.2 kg)  01/11/10 175 lb (79.379 kg)  05/17/09 175 lb (79.379 kg)   Patient had normal thyroid function, normal glucose levels and normal A1C. No physical symptoms.  Plan - next step is to do a 5 day calorie count.           If calorie intake is normal or low, approx 1900 Kcal/day normal maintenance, will need further evaluation

## 2010-10-11 NOTE — Progress Notes (Signed)
Subjective:    Patient ID: Tara [name], female    DOB: [birth year], 55 y.o.   MRN: [id number]  HPI Tara [name] presents for a n unexpected and unexplained gain in weight. Reviewing her chart she was steady at 79 kg but has now gained to 95 kg. She reports no change in diet or level of activity. She was concerned for potential metabolic abnormality to explain 15 kg weight gain. She has been feeling well with no specific complaints.  Past Medical History  Diagnosis Date  . Seizures   . Reactive depression (situational)   . Heart murmur     Birth Defect  . Cervical spine tumor   . Costochondritis   . PE (pulmonary embolism) 2007    Small  . Chest pain 11/07    Neg eval by cath   Past Surgical History  Procedure Date  . Cesarean section 1982  . Surgical repair for displaced rod fixation 2003  . Removal of benign tumor - cervical spine   . Abdominal hysterectomy 2007   Family History  Problem Relation Age of Onset  . Miscarriages / Stillbirths Father   . Hypertension Father   . Hyperlipidemia Father   . Coronary artery disease Father   . Diabetes Father   . Heart attack Brother   . Kidney failure Daughter   . Colon cancer Paternal Aunt   . Breast cancer Paternal Aunt   . Diabetes Brother     Severe complication including renal failure, amputation   History   Social History  . Marital Status: Married    Spouse Name: N/A    Number of Children: N/A  . Years of Education: N/A   Occupational History  . Retail Sales    Social History Main Topics  . Smoking status: Never Smoker   . Smokeless tobacco: Never Used  . Alcohol Use: No  . Drug Use: No  . Sexually Active: Yes   Other Topics Concern  . Not on file   Social History Narrative   Tara Shan DiegoMarried [id number] daughters - '82, '85; 1 son - '91       Review of Systems Constitutional:  Negative for fever, chills, activity change. Positive unexpected weight change.  HEENT:  Negative for  hearing loss, ear pain, congestion, neck stiffness and postnasal drip. Negative for sore throat or swallowing problems. Negative for dental complaints.   Eyes: Negative for vision loss or change in visual acuity.  Respiratory: Negative for chest tightness and wheezing. Negative for DOE.   Cardiovascular: Negative for chest pain or palpitations. No decreased exercise tolerance Gastrointestinal: No change in bowel habit. No bloating or gas. No reflux or indigestion Genitourinary: Negative for urgency, frequency, flank pain and difficulty urinating.  Musculoskeletal: Negative for myalgias, back pain, arthralgias and gait problem.  Neurological: Negative for dizziness, tremors, weakness and headaches.  Hematological: Negative for adenopathy.  Psychiatric/Behavioral: Negative for behavioral problems and dysphoric mood.       Objective:   Physical Exam Vitals noted - significant weight gain from previous visit Gen'l - WNWD white woman in no distress HEENT C&S clear w/o jaundice Cor- RRR, 2+ radial pulse Pulm - normal respirations.  Lab Results  Component Value Date   WBC 6.9 12/19/2007   HGB 14.8 12/19/2007   HCT 42.0 12/19/2007   PLT 260 12/19/2007   NA 141 10/05/2010   K 4.1 10/05/2010   CL 107 10/05/2010   CREATININE 0.8 10/05/2010   BUN 14 10/05/2010  CO2 24 10/05/2010   TSH 1.46 10/05/2010   HGBA1C 5.9 10/05/2010          Assessment & Plan:

## 2011-05-11 ENCOUNTER — Other Ambulatory Visit: Payer: Self-pay | Admitting: Obstetrics and Gynecology

## 2013-01-08 ENCOUNTER — Ambulatory Visit (INDEPENDENT_AMBULATORY_CARE_PROVIDER_SITE_OTHER)
Admission: RE | Admit: 2013-01-08 | Discharge: 2013-01-08 | Disposition: A | Discharge status: Home / Self Care | Payer: Federal, State, Local not specified - PPO | Source: Ambulatory Visit | Attending: Internal Medicine | Admitting: Internal Medicine

## 2013-01-08 ENCOUNTER — Ambulatory Visit (INDEPENDENT_AMBULATORY_CARE_PROVIDER_SITE_OTHER): Payer: Federal, State, Local not specified - PPO | Admitting: Internal Medicine

## 2013-01-08 ENCOUNTER — Other Ambulatory Visit (INDEPENDENT_AMBULATORY_CARE_PROVIDER_SITE_OTHER): Payer: Federal, State, Local not specified - PPO

## 2013-01-08 ENCOUNTER — Encounter: Payer: Self-pay | Admitting: Internal Medicine

## 2013-01-08 VITALS — BP 120/82 | HR 77 | Temp 98.5°F | Resp 16 | Ht 64.0 in

## 2013-01-08 DIAGNOSIS — R7309 Other abnormal glucose: Secondary | ICD-10-CM

## 2013-01-08 DIAGNOSIS — R079 Chest pain, unspecified: Secondary | ICD-10-CM

## 2013-01-08 DIAGNOSIS — R059 Cough, unspecified: Secondary | ICD-10-CM

## 2013-01-08 DIAGNOSIS — R05 Cough: Secondary | ICD-10-CM

## 2013-01-08 DIAGNOSIS — J189 Pneumonia, unspecified organism: Secondary | ICD-10-CM

## 2013-01-08 DIAGNOSIS — E119 Type 2 diabetes mellitus without complications: Secondary | ICD-10-CM | POA: Insufficient documentation

## 2013-01-08 DIAGNOSIS — R7301 Impaired fasting glucose: Secondary | ICD-10-CM | POA: Insufficient documentation

## 2013-01-08 LAB — COMPREHENSIVE METABOLIC PANEL
AST: 29 U/L (ref 0–37)
BUN: 14 mg/dL (ref 6–23)
CO2: 27 mEq/L (ref 19–32)
Calcium: 8.8 mg/dL (ref 8.4–10.5)
Chloride: 106 mEq/L (ref 96–112)
Creatinine, Ser: 0.7 mg/dL (ref 0.4–1.2)
GFR: 88.56 mL/min (ref >60.00)
Potassium: 3.8 mEq/L (ref 3.5–5.1)
Total Bilirubin: 0.8 mg/dL (ref 0.3–1.2)

## 2013-01-08 LAB — CBC WITH DIFFERENTIAL/PLATELET
Basophils Absolute: 0 10*3/uL (ref 0.0–0.1)
Eosinophils Absolute: 0.1 10*3/uL (ref 0.0–0.7)
Eosinophils Relative: 2.2 % (ref 0.0–5.0)
Monocytes Absolute: 0.4 10*3/uL (ref 0.1–1.0)
Monocytes Relative: 6.6 % (ref 3.0–12.0)
RDW: 13.5 % (ref 11.5–14.6)
WBC: 6.2 10*3/uL (ref 4.5–10.5)

## 2013-01-08 LAB — CARDIAC PANEL
CK-MB: 0.8 ng/mL (ref 0.3–4.0)
Total CK: 80 U/L (ref 7–177)

## 2013-01-08 LAB — HEMOGLOBIN A1C: Hgb A1c MFr Bld: 6.4 % (ref 4.6–6.5)

## 2013-01-08 MED ORDER — HYDROCODONE-HOMATROPINE 5-1.5 MG/5ML PO SYRP: 5.0000 mL | ORAL_SOLUTION | Freq: Three times a day (TID) | ORAL | Status: DC | PRN

## 2013-01-08 MED ORDER — MOXIFLOXACIN HCL 400 MG PO TABS: 400.0000 mg | ORAL_TABLET | Freq: Every day | ORAL | Status: DC

## 2013-01-08 NOTE — Progress Notes (Signed)
Pre visit review using our clinic review tool, if applicable. No additional management support is needed unless otherwise documented below in the visit note. 

## 2013-01-08 NOTE — Patient Instructions (Signed)

## 2013-01-08 NOTE — Assessment & Plan Note (Addendum)
Her EKG shows diffuse nonspecific ST/T wave abnormalities I will check her cardiac enzymes to screen for ischemia and a d-dimer to look for PE Her pain is atypical and chronic/reccurrent so I do not refer her for emergent care  Late note - all of her labs and enzymes are normal, will consider sending her for further cardiac testing, maybe an MPI, in light of her nonspecific EKG changes, I don't think she is a good candidate for an ETT

## 2013-01-08 NOTE — Progress Notes (Signed)
Subjective:    Patient ID: Tara [name], female    DOB: [birth year], 57 y.o.   MRN: [id number]  Cough This is a new problem. The current episode started in the past 7 days. The problem has been gradually worsening. The problem occurs every few hours. The cough is productive of purulent sputum. Associated symptoms include chest pain. Pertinent negatives include no chills, ear congestion, ear pain, fever, headaches, heartburn, hemoptysis, myalgias, nasal congestion, postnasal drip, rash, rhinorrhea, sore throat, shortness of breath, sweats, weight loss or wheezing. She has tried OTC cough suppressant for the symptoms. The treatment provided mild relief.  Chest Pain  This is a recurrent problem. The current episode started in the past 7 days. The onset quality is gradual. The problem occurs intermittently. The problem has been unchanged. The pain is present in the substernal region. The pain is at a severity of 1/10. The pain is mild. The quality of the pain is described as tightness and dull. The pain does not radiate. Associated symptoms include a cough and nausea. Pertinent negatives include no back pain, claudication, diaphoresis, dizziness, exertional chest pressure, fever, headaches, hemoptysis, irregular heartbeat, leg pain, lower extremity edema, malaise/fatigue, near-syncope, numbness, orthopnea, palpitations, PND, shortness of breath, sputum production, syncope, vomiting or weakness. The pain is aggravated by nothing.      Review of Systems  Constitutional: Negative.  Negative for fever, chills, weight loss, malaise/fatigue, diaphoresis, activity change, appetite change, fatigue and unexpected weight change.  HENT: Negative.  Negative for ear pain, postnasal drip, rhinorrhea and sore throat.   Eyes: Negative.   Respiratory: Positive for cough. Negative for apnea, hemoptysis, sputum production, choking, chest tightness, shortness of breath, wheezing and stridor.   Cardiovascular:  Positive for chest pain. Negative for palpitations, orthopnea, claudication, leg swelling, syncope, PND and near-syncope.  Gastrointestinal: Positive for nausea. Negative for heartburn, vomiting, diarrhea, constipation, blood in stool and anal bleeding.  Endocrine: Negative.   Genitourinary: Negative.   Musculoskeletal: Negative.  Negative for arthralgias, back pain, myalgias and neck stiffness.  Skin: Negative.  Negative for rash.  Allergic/Immunologic: Negative.   Neurological: Negative.  Negative for dizziness, tremors, speech difficulty, weakness, light-headedness, numbness and headaches.  Hematological: Negative.  Negative for adenopathy. Does not bruise/bleed easily.  Psychiatric/Behavioral: Negative.        Objective:   Physical Exam  Vitals reviewed. Constitutional: She is oriented to person, place, and time. She appears well-developed and well-nourished.  Non-toxic appearance. She does not have a sickly appearance. She does not appear ill. No distress.  HENT:  Head: Normocephalic and atraumatic.  Mouth/Throat: Oropharynx is clear and moist. No oropharyngeal exudate.  Eyes: Conjunctivae are normal. Right eye exhibits no discharge. Left eye exhibits no discharge. No scleral icterus.  Neck: Normal range of motion. Neck supple. No JVD present. No tracheal deviation present. No thyromegaly present.  Cardiovascular: Normal rate, regular rhythm, normal heart sounds and intact distal pulses.  Exam reveals no gallop and no friction rub.   No murmur heard. Pulmonary/Chest: Effort normal and breath sounds normal. No accessory muscle usage or stridor. Not tachypneic. No respiratory distress. She has no decreased breath sounds. She has no wheezes. She has no rhonchi. She has no rales. She exhibits no tenderness.  Abdominal: Soft. Bowel sounds are normal. She exhibits no distension and no mass. There is no tenderness. There is no rebound and no guarding.  Musculoskeletal: Normal range of motion.  She exhibits no edema and no tenderness.  Lymphadenopathy:    She  has no cervical adenopathy.  Neurological: She is oriented to person, place, and time.  Skin: Skin is warm and dry. No rash noted. She is not diaphoretic. No erythema. No pallor.  Psychiatric: She has a normal mood and affect. Her behavior is normal. Judgment and thought content normal.     Lab Results  Component Value Date   WBC 6.9 12/19/2007   HGB 14.8 12/19/2007   HCT 42.0 12/19/2007   PLT 260 12/19/2007   GLUCOSE 115* 10/05/2010   NA 141 10/05/2010   K 4.1 10/05/2010   CL 107 10/05/2010   CREATININE 0.8 10/05/2010   BUN 14 10/05/2010   CO2 24 10/05/2010   TSH 1.46 10/05/2010   HGBA1C 5.9 10/05/2010       Assessment & Plan:

## 2013-01-09 ENCOUNTER — Encounter: Payer: Self-pay | Admitting: Internal Medicine

## 2013-01-09 NOTE — Assessment & Plan Note (Signed)
Her CXR is normal.

## 2013-01-09 NOTE — Assessment & Plan Note (Signed)
Her A1C shows that she has prediabetes

## 2013-01-09 NOTE — Assessment & Plan Note (Signed)
I will treat the infection with avelox and will control the cough with hycodan 

## 2013-01-10 ENCOUNTER — Telehealth: Payer: Self-pay | Admitting: *Deleted

## 2013-01-10 NOTE — Telephone Encounter (Signed)
To[facility]ake Fax: [phone] From: [facility] Date/ Time: 01/08/2013 3:21 PM Taken By: Marcello Moores, CSR Caller: [place]: Randell Loop Patient: Tara [name], [place] DOB: February 09, 1955 Phone: [phone] Reason for Call: Janett Billow is calling from [facility] regarding a D-dimer, Traponin I ordered on Milana Obey by Jones12/31/2014 9:24:00 AM. The results were D-dimer = 0.27 Traponin I < 0.30. Regarding Appointment: Appt Date: Appt Time: Unknown Provider: Reason: Details: Outcome:

## 2013-04-25 ENCOUNTER — Ambulatory Visit: Payer: Federal, State, Local not specified - PPO | Admitting: Internal Medicine

## 2013-04-25 ENCOUNTER — Ambulatory Visit (INDEPENDENT_AMBULATORY_CARE_PROVIDER_SITE_OTHER): Payer: Federal, State, Local not specified - PPO | Admitting: Internal Medicine

## 2013-04-25 ENCOUNTER — Encounter: Payer: Self-pay | Admitting: Internal Medicine

## 2013-04-25 VITALS — BP 150/90 | HR 95 | Temp 98.3°F

## 2013-04-25 DIAGNOSIS — B0229 Other postherpetic nervous system involvement: Secondary | ICD-10-CM

## 2013-04-25 DIAGNOSIS — B028 Zoster with other complications: Principal | ICD-10-CM

## 2013-04-25 MED ORDER — GABAPENTIN 100 MG PO CAPS: ORAL_CAPSULE | ORAL | Status: DC

## 2013-04-25 MED ORDER — FAMCICLOVIR 500 MG PO TABS: 500.0000 mg | ORAL_TABLET | Freq: Three times a day (TID) | ORAL | Status: DC

## 2013-04-25 MED ORDER — DAPSONE 5 % EX GEL: CUTANEOUS | Status: DC

## 2013-04-25 NOTE — Progress Notes (Signed)
Pre visit review using our clinic review tool, if applicable. No additional management support is needed unless otherwise documented below in the visit note. 

## 2013-04-25 NOTE — Patient Instructions (Signed)

## 2013-04-25 NOTE — Progress Notes (Signed)
   Subjective:    Patient ID: Tara [name], female    DOB: Aug 11, 1955, 58 y.o.   MRN: [id number]  HPI Her concern is over a rash on her right shoulder and upper back which appeared last Friday, 04/18/13.  She describes the rash initially as blisters with burning with itching. Her first symptom was itching, then she noticed the bumps.  Last Wednesday and Thursday night she stayed at a hotel. No one else traveling with her has experienced these symptoms.  Her sister gave her a sample of Aczone gel for a facial cyst; she tried the gel a couple of times on the affected area on her back.  She believes the symptoms are improving, yet has concerns this is possibly shingles. She had chicken pox as a child and has not taken the shingles vaccine.  She has felt more fatigued since developing the rash.  Remotely, she is also experiencing frontal headache with maxillary pain, itchy water eyes, clear nasal secretions. She has had relief from headaches since the recent rain.   Review of Systems Denies fever, chills, sweats, unexplained weight loss, nausea, vomiting, diarrhea, abdominal pain, black or tarry stools, rectal bleeding.  Denies burning with urination, pus or blood in urine, joint swelling or redness.  Denies abnormal bruising or bleeding or enlarged lymph nodes.      Objective:   Physical Exam Gen.: Healthy and well-nourished in appearance. Alert, appropriate and cooperative throughout exam.  Appears younger than stated age  Head: Normocephalic without obvious abnormalities.  Eyes: No corneal or conjunctival inflammation noted. Pupils equal round reactive to light and accommodation. Extraocular motion intact. Vision grossly normal with lenses  Ears: External ear exam reveals no significant lesions or deformities. Canals clear. Hearing is grossly normal bilaterally.  Nose: External nasal exam reveals no deformity or inflammation. Nasal mucosa are pink and moist. No lesions or exudates noted.    Mouth: Oral mucosa and oropharynx reveal no lesions or exudates. Oropharyngeal crowding; uvula and tonsillar pillars not visible on inspection. Teeth in good repair.  Neck: No deformities, masses, or tenderness noted. ROM limited significantly R > L from prior cervical cyst removal.  Lungs: Normal respiratory effort; chest expands symmetrically. Lungs are clear to auscultation without rales, wheezes, or increased work of breathing. Airway clear with hyperventilation.  Heart: Normal rate and rhythm. 7.6/1 systolic murmur with slurred S2. No gallop, click, or rub.  Abdomen: Bowel sounds normal; abdomen soft and nontender. No masses, organomegaly or hernias noted.  Musculoskeletal/extremities: No deformity or scoliosis noted of the thoracic or lumbar spine.  No clubbing, cyanosis, edema, or significant extremity deformity noted. Range of motion normal. Tone & strength normal.  Hand joints normal.  Fingernail health thin, brittle.  Able to lie down & sit up w/o help.  Vascular: Carotid, radial artery, dorsalis pedis and posterior tibial pulses are full and equal. No bruits present.  Neurologic: Alert and oriented x3.  Skin: R posterior C8 area diffuse constellation of papules with erythematous base; excoriation noted.  Lymph: No cervical, axillary lymphadenopathy present.  Psych: Mood and affect are normal. Normally interactive.       Assessment & Plan:  #1 herpes zoster, cervical dermatome  #2 postherpetic neuralgia  #3 acneiform dermatitis  Plan: See orders

## 2013-04-25 NOTE — Progress Notes (Signed)
Patient refused to have her weight taken today.

## 2013-04-25 NOTE — Progress Notes (Signed)
Subjective:    Patient ID: Tara [name], female    DOB: 04-23-1955, 58 y.o.   MRN: [id number]  HPI  Her concern is over a rash on her right shoulder and upper back which appeared last Friday, 04/18/13.  She describes the rash as itchy bumps and blisters with mild burning. Her first symptom was itching, then she noticed the bumps.  Last Wednesday and Thursday night she stayed at a hotel. No one else traveling with her has experienced these symptoms.  Her sister gave her a sample of Actone gel for a facial cyst; she tried the gel a couple of times on the affected area on her back. She believes the symptoms are improving, yet has concerns this is possibly shingles. She had chicken pox as a child and has not taken the shingles vaccine.  She has felt more fatigued since developing the rash.   Remotely, she is also experiencing frontal headache with maxillary pain, itchy water eyes, clear nasal secretions. She has had relief from headaches since the recent rain.    Review of Systems Denies fever, chills, sweats, unexplained weight loss, nausea, vomiting, diarrhea, abdominal pain, black or tarry stools, rectal bleeding.  Denies burning with urination, pus or blood in urine, joint swelling or redness.  Denies abnormal bruising or bleeding or enlarged lymph nodes.      Objective:   Physical Exam Gen.: Healthy and well-nourished in appearance. Alert, appropriate and cooperative throughout exam. Appears younger than stated age  Head: Normocephalic without obvious abnormalities.  Eyes: No corneal or conjunctival inflammation noted. Pupils equal round reactive to light and accommodation. Extraocular motion intact. Vision grossly normal with lenses Ears: External  ear exam reveals no significant lesions or deformities. Canals clear. Hearing is grossly normal bilaterally. Nose: External nasal exam reveals no deformity or inflammation. Nasal mucosa are pink and moist. No lesions or exudates noted.    Mouth: Oral mucosa and oropharynx reveal no lesions or exudates. Oropharyngeal crowding; uvula and tonsillar pillars not visible on inspection. Teeth in good repair. Neck: No deformities, masses, or tenderness noted. ROM limited significantly R > L from prior cervical cyst removal.  Lungs: Normal respiratory effort; chest expands symmetrically. Lungs are clear to auscultation without rales, wheezes, or increased work of breathing. Airway clear with hyperventilation.  Heart: Normal rate and rhythm. 1.6/0 systolic murmur with slurred S2. No gallop, click, or rub.  Abdomen: Bowel sounds normal; abdomen soft and nontender. No masses, organomegaly or hernias noted. Musculoskeletal/extremities: No deformity or scoliosis noted of the thoracic or lumbar spine.  No clubbing, cyanosis, edema, or significant extremity  deformity noted. Range of motion normal. Tone & strength normal. Hand joints normal.  Fingernail  health thin, brittle.  Able to lie down & sit up w/o help.  Vascular: Carotid, radial artery, dorsalis pedis and  posterior tibial pulses are full and equal. No bruits present. Neurologic: Alert and oriented x3. Deep tendon reflexes symmetrical and normal.  Skin: diffuse constellation of papules with erythematous base; excoriation noted.  Lymph: No cervical, axillary, or inguinal lymphadenopathy present. Psych: Mood and affect are normal. Normally interactive  Assessment & Plan:

## 2013-05-15 ENCOUNTER — Encounter: Payer: Self-pay | Admitting: Internal Medicine

## 2014-02-24 ENCOUNTER — Encounter: Payer: Self-pay | Admitting: Internal Medicine

## 2014-02-24 ENCOUNTER — Other Ambulatory Visit (INDEPENDENT_AMBULATORY_CARE_PROVIDER_SITE_OTHER): Payer: Federal, State, Local not specified - PPO

## 2014-02-24 ENCOUNTER — Ambulatory Visit (INDEPENDENT_AMBULATORY_CARE_PROVIDER_SITE_OTHER): Payer: Federal, State, Local not specified - PPO | Admitting: Internal Medicine

## 2014-02-24 ENCOUNTER — Ambulatory Visit: Payer: Federal, State, Local not specified - PPO | Admitting: Internal Medicine

## 2014-02-24 VITALS — BP 144/90 | HR 84 | Temp 98.2°F | Resp 14 | Ht 64.0 in | Wt 211.0 lb

## 2014-02-24 DIAGNOSIS — R7989 Other specified abnormal findings of blood chemistry: Secondary | ICD-10-CM

## 2014-02-24 DIAGNOSIS — R635 Abnormal weight gain: Secondary | ICD-10-CM

## 2014-02-24 DIAGNOSIS — Z299 Encounter for prophylactic measures, unspecified: Secondary | ICD-10-CM

## 2014-02-24 DIAGNOSIS — Z0000 Encounter for general adult medical examination without abnormal findings: Secondary | ICD-10-CM

## 2014-02-24 DIAGNOSIS — R7301 Impaired fasting glucose: Secondary | ICD-10-CM

## 2014-02-24 DIAGNOSIS — Z23 Encounter for immunization: Secondary | ICD-10-CM

## 2014-02-24 LAB — COMPREHENSIVE METABOLIC PANEL
ALT: 46 U/L — ABNORMAL HIGH (ref 0–35)
AST: 24 U/L (ref 0–37)
Albumin: 4.4 g/dL (ref 3.5–5.2)
Alkaline Phosphatase: 92 U/L (ref 39–117)
BUN: 13 mg/dL (ref 6–23)
CALCIUM: 9.9 mg/dL (ref 8.4–10.5)
CO2: 28 mEq/L (ref 19–32)
Chloride: 105 mEq/L (ref 96–112)
Creatinine, Ser: 0.73 mg/dL (ref 0.40–1.20)
GFR: 86.82 mL/min (ref >60.00)
GLUCOSE: 71 mg/dL (ref 70–99)
Potassium: 4 mEq/L (ref 3.5–5.1)
SODIUM: 140 meq/L (ref 135–145)
Total Bilirubin: 0.4 mg/dL (ref 0.2–1.2)
Total Protein: 7.6 g/dL (ref 6.0–8.3)

## 2014-02-24 LAB — TSH: TSH: 1.41 u[IU]/mL (ref 0.35–4.50)

## 2014-02-24 LAB — LIPID PANEL
CHOL/HDL RATIO: 6
Cholesterol: 258 mg/dL — ABNORMAL HIGH (ref 0–200)
HDL: 45.2 mg/dL (ref >39.00)
NonHDL: 212.8
Triglycerides: 289 mg/dL — ABNORMAL HIGH (ref 0.0–149.0)
VLDL: 57.8 mg/dL — ABNORMAL HIGH (ref 0.0–40.0)

## 2014-02-24 LAB — LDL CHOLESTEROL, DIRECT: LDL DIRECT: 157 mg/dL

## 2014-02-24 LAB — HEMOGLOBIN A1C: Hgb A1c MFr Bld: 6.2 % (ref 4.6–6.5)

## 2014-02-24 LAB — T4, FREE: FREE T4: 0.73 ng/dL (ref 0.60–1.60)

## 2014-02-24 NOTE — Patient Instructions (Signed)
We will check on the blood work today. We will call you back with the results.   We have given you the tetanus shot today.   You do need a colon cancer screening and we can send in a referral when you are ready.   Health Maintenance Adopting a healthy lifestyle and getting preventive care can go a long way to promote health and wellness. Talk with your health care provider about what schedule of regular examinations is right for you. This is a good chance for you to check in with your provider about disease prevention and staying healthy. In between checkups, there are plenty of things you can do on your own. Experts have done a lot of research about which lifestyle changes and preventive measures are most likely to keep you healthy. Ask your health care provider for more information. WEIGHT AND DIET  Eat a healthy diet  Be sure to include plenty of vegetables, fruits, low-fat dairy products, and lean protein.  Do not eat a lot of foods high in solid fats, added sugars, or salt.  Get regular exercise. This is one of the most important things you can do for your health.  Most adults should exercise for at least 150 minutes each week. The exercise should increase your heart rate and make you sweat (moderate-intensity exercise).  Most adults should also do strengthening exercises at least twice a week. This is in addition to the moderate-intensity exercise.  Maintain a healthy weight  Body mass index (BMI) is a measurement that can be used to identify possible weight problems. It estimates body fat based on height and weight. Your health care provider can help determine your BMI and help you achieve or maintain a healthy weight.  For females 75 years of age and older:   A BMI below 18.5 is considered underweight.  A BMI of 18.5 to 24.9 is normal.  A BMI of 25 to 29.9 is considered overweight.  A BMI of 30 and above is considered obese.  Watch levels of cholesterol and blood  lipids  You should start having your blood tested for lipids and cholesterol at 59 years of age, then have this test every 5 years.  You may need to have your cholesterol levels checked more often if:  Your lipid or cholesterol levels are high.  You are older than 59 years of age.  You are at high risk for heart disease.  CANCER SCREENING   Lung Cancer  Lung cancer screening is recommended for adults 83-29 years old who are at high risk for lung cancer because of a history of smoking.  A yearly low-dose CT scan of the lungs is recommended for people who:  Currently smoke.  Have quit within the past 15 years.  Have at least a 30-pack-year history of smoking. A pack year is smoking an average of one pack of cigarettes a day for 1 year.  Yearly screening should continue until it has been 15 years since you quit.  Yearly screening should stop if you develop a health problem that would prevent you from having lung cancer treatment.  Breast Cancer  Practice breast self-awareness. This means understanding how your breasts normally appear and feel.  It also means doing regular breast self-exams. Let your health care provider know about any changes, no matter how small.  If you are in your 20s or 30s, you should have a clinical breast exam (CBE) by a health care provider every 1-3 years as part of  a regular health exam.  If you are 66 or older, have a CBE every year. Also consider having a breast X-ray (mammogram) every year.  If you have a family history of breast cancer, talk to your health care provider about genetic screening.  If you are at high risk for breast cancer, talk to your health care provider about having an MRI and a mammogram every year.  Breast cancer gene (BRCA) assessment is recommended for women who have family members with BRCA-related cancers. BRCA-related cancers include:  Breast.  Ovarian.  Tubal.  Peritoneal cancers.  Results of the assessment  will determine the need for genetic counseling and BRCA1 and BRCA2 testing. Cervical Cancer Routine pelvic examinations to screen for cervical cancer are no longer recommended for nonpregnant women who are considered low risk for cancer of the pelvic organs (ovaries, uterus, and vagina) and who do not have symptoms. A pelvic examination may be necessary if you have symptoms including those associated with pelvic infections. Ask your health care provider if a screening pelvic exam is right for you.   The Pap test is the screening test for cervical cancer for women who are considered at risk.  If you had a hysterectomy for a problem that was not cancer or a condition that could lead to cancer, then you no longer need Pap tests.  If you are older than 65 years, and you have had normal Pap tests for the past 10 years, you no longer need to have Pap tests.  If you have had past treatment for cervical cancer or a condition that could lead to cancer, you need Pap tests and screening for cancer for at least 20 years after your treatment.  If you no longer get a Pap test, assess your risk factors if they change (such as having a new sexual partner). This can affect whether you should start being screened again.  Some women have medical problems that increase their chance of getting cervical cancer. If this is the case for you, your health care provider may recommend more frequent screening and Pap tests.  The human papillomavirus (HPV) test is another test that may be used for cervical cancer screening. The HPV test looks for the virus that can cause cell changes in the cervix. The cells collected during the Pap test can be tested for HPV.  The HPV test can be used to screen women 66 years of age and older. Getting tested for HPV can extend the interval between normal Pap tests from three to five years.  An HPV test also should be used to screen women of any age who have unclear Pap test results.  After  59 years of age, women should have HPV testing as often as Pap tests.  Colorectal Cancer  This type of cancer can be detected and often prevented.  Routine colorectal cancer screening usually begins at 59 years of age and continues through 59 years of age.  Your health care provider may recommend screening at an earlier age if you have risk factors for colon cancer.  Your health care provider may also recommend using home test kits to check for hidden blood in the stool.  A small camera at the end of a tube can be used to examine your colon directly (sigmoidoscopy or colonoscopy). This is done to check for the earliest forms of colorectal cancer.  Routine screening usually begins at age 22.  Direct examination of the colon should be repeated every 5-10 years through  59 years of age. However, you may need to be screened more often if early forms of precancerous polyps or small growths are found. Skin Cancer  Check your skin from head to toe regularly.  Tell your health care provider about any new moles or changes in moles, especially if there is a change in a mole's shape or color.  Also tell your health care provider if you have a mole that is larger than the size of a pencil eraser.  Always use sunscreen. Apply sunscreen liberally and repeatedly throughout the day.  Protect yourself by wearing long sleeves, pants, a wide-brimmed hat, and sunglasses whenever you are outside. HEART DISEASE, DIABETES, AND HIGH BLOOD PRESSURE   Have your blood pressure checked at least every 1-2 years. High blood pressure causes heart disease and increases the risk of stroke.  If you are between 57 years and 80 years old, ask your health care provider if you should take aspirin to prevent strokes.  Have regular diabetes screenings. This involves taking a blood sample to check your fasting blood sugar level.  If you are at a normal weight and have a low risk for diabetes, have this test once every  three years after 59 years of age.  If you are overweight and have a high risk for diabetes, consider being tested at a younger age or more often. PREVENTING INFECTION  Hepatitis B  If you have a higher risk for hepatitis B, you should be screened for this virus. You are considered at high risk for hepatitis B if:  You were born in a country where hepatitis B is common. Ask your health care provider which countries are considered high risk.  Your parents were born in a high-risk country, and you have not been immunized against hepatitis B (hepatitis B vaccine).  You have HIV or AIDS.  You use needles to inject street drugs.  You live with someone who has hepatitis B.  You have had sex with someone who has hepatitis B.  You get hemodialysis treatment.  You take certain medicines for conditions, including cancer, organ transplantation, and autoimmune conditions. Hepatitis C  Blood testing is recommended for:  Everyone born from [birth year] through 1965.  Anyone with known risk factors for hepatitis C. Sexually transmitted infections (STIs)  You should be screened for sexually transmitted infections (STIs) including gonorrhea and chlamydia if:  You are sexually active and are younger than 59 years of age.  You are older than 59 years of age and your health care provider tells you that you are at risk for this type of infection.  Your sexual activity has changed since you were last screened and you are at an increased risk for chlamydia or gonorrhea. Ask your health care provider if you are at risk.  If you do not have HIV, but are at risk, it may be recommended that you take a prescription medicine daily to prevent HIV infection. This is called pre-exposure prophylaxis (PrEP). You are considered at risk if:  You are sexually active and do not regularly use condoms or know the HIV status of your partner(s).  You take drugs by injection.  You are sexually active with a partner who  has HIV. Talk with your health care provider about whether you are at high risk of being infected with HIV. If you choose to begin PrEP, you should first be tested for HIV. You should then be tested every 3 months for as long as you are taking PrEP.  PREGNANCY   If you are premenopausal and you may become pregnant, ask your health care provider about preconception counseling.  If you may become pregnant, take 400 to 800 micrograms (mcg) of folic acid every day.  If you want to prevent pregnancy, talk to your health care provider about birth control (contraception). OSTEOPOROSIS AND MENOPAUSE   Osteoporosis is a disease in which the bones lose minerals and strength with aging. This can result in serious bone fractures. Your risk for osteoporosis can be identified using a bone density scan.  If you are 4 years of age or older, or if you are at risk for osteoporosis and fractures, ask your health care provider if you should be screened.  Ask your health care provider whether you should take a calcium or vitamin D supplement to lower your risk for osteoporosis.  Menopause may have certain physical symptoms and risks.  Hormone replacement therapy may reduce some of these symptoms and risks. Talk to your health care provider about whether hormone replacement therapy is right for you.  HOME CARE INSTRUCTIONS   Schedule regular health, dental, and eye exams.  Stay current with your immunizations.   Do not use any tobacco products including cigarettes, chewing tobacco, or electronic cigarettes.  If you are pregnant, do not drink alcohol.  If you are breastfeeding, limit how much and how often you drink alcohol.  Limit alcohol intake to no more than 1 drink per day for nonpregnant women. One drink equals 12 ounces of beer, 5 ounces of wine, or 1 ounces of hard liquor.  Do not use street drugs.  Do not share needles.  Ask your health care provider for help if you need support or  information about quitting drugs.  Tell your health care provider if you often feel depressed.  Tell your health care provider if you have ever been abused or do not feel safe at home. Document Released: 07/11/2010 Document Revised: 05/12/2013 Document Reviewed: 11/27/2012 [facility] Patient Information 2015 [place], [place]. This information is not intended to replace advice given to you by your health care provider. Make sure you discuss any questions you have with your health care provider.

## 2014-02-24 NOTE — Progress Notes (Signed)
Pre visit review using our clinic review tool, if applicable. No additional management support is needed unless otherwise documented below in the visit note. 

## 2014-02-27 ENCOUNTER — Encounter: Payer: Self-pay | Admitting: Internal Medicine

## 2014-02-27 DIAGNOSIS — Z0000 Encounter for general adult medical examination without abnormal findings: Secondary | ICD-10-CM | POA: Insufficient documentation

## 2014-02-27 NOTE — Assessment & Plan Note (Signed)
Tdap given today. Up to date on mammogram. She has not had colonoscopy and we discussed that today. Talked to her about the risks and benefits to screening. She declines flu shot today. Pap smear up to date as well.

## 2014-02-27 NOTE — Progress Notes (Signed)
   Subjective:    Patient ID: Tara [name], female    DOB: 26-Apr-1955, 59 y.o.   MRN: [id number]  HPI The patient is a 59 YO female who is coming in for wellness. She has recovered fully from an episode of shingles earlier this year. She denies any other new complaints. We discuss her chronic medical problems (please see A/P for status and plans). She is concerned about her thyroid today as she has been gaining weight when she hasn't been trying. She denies constipation or tremors. She denies cold or heat intolerance. She does have a lot of stress in her life.   PMH, [facility], social history, allergies, medications reviewed and updated.   Review of Systems  Constitutional: Positive for unexpected weight change. Negative for fever, chills, activity change, appetite change and fatigue.  HENT: Negative.   Eyes: Negative.   Respiratory: Negative for cough, chest tightness, shortness of breath and wheezing.   Cardiovascular: Negative for chest pain, palpitations and leg swelling.  Gastrointestinal: Negative for abdominal pain, diarrhea, constipation and abdominal distention.  Endocrine: Negative.   Musculoskeletal: Negative.   Skin: Negative.   Neurological: Negative.   Psychiatric/Behavioral: Positive for dysphoric mood. Negative for behavioral problems, sleep disturbance and decreased concentration. The patient is not nervous/anxious.       Objective:   Physical Exam  Constitutional: She is oriented to person, place, and time. She appears well-developed and well-nourished.  HENT:  Head: Normocephalic and atraumatic.  Eyes: EOM are normal.  Neck: Normal range of motion.  Cardiovascular: Normal rate and regular rhythm.   Pulmonary/Chest: Effort normal and breath sounds normal. No respiratory distress. She has no wheezes.  Abdominal: Soft. Bowel sounds are normal. She exhibits no distension. There is no tenderness. There is no rebound.  Musculoskeletal: She exhibits no edema.    Neurological: She is alert and oriented to person, place, and time. Coordination normal.  Skin: Skin is warm and dry.  Psychiatric: She has a normal mood and affect. Her behavior is normal.   Filed Vitals:   02/24/14 1354  BP: 144/90  Pulse: 84  Temp: 98.2 F (36.8 C)  TempSrc: Oral  Resp: 14  Height: 5\' 4"  (1.626 m)  Weight: 211 lb (95.709 kg)  SpO2: 98%      Assessment & Plan:  Tdap given today.

## 2014-02-27 NOTE — Assessment & Plan Note (Signed)
Checking thyroid and HgA1c today. Concern that the stressors in her life (husband's health especially) may be contributing to the weight gain.

## 2014-02-27 NOTE — Assessment & Plan Note (Signed)
Check HgA1c today.  

## 2014-09-18 ENCOUNTER — Telehealth: Payer: Self-pay | Admitting: General Practice

## 2014-09-18 NOTE — Telephone Encounter (Signed)
Left message on answering machine about scheduling annual mammogram.

## 2014-09-18 NOTE — Telephone Encounter (Signed)
Patient returned call and said that she has mammograms every two years.  The last one was on 07/18/13 with Dr. Delano Metz OBGYN.  Next mammogram due 07/19/15.

## 2015-05-04 ENCOUNTER — Ambulatory Visit (INDEPENDENT_AMBULATORY_CARE_PROVIDER_SITE_OTHER): Payer: Federal, State, Local not specified - PPO | Admitting: Family

## 2015-05-04 ENCOUNTER — Ambulatory Visit (INDEPENDENT_AMBULATORY_CARE_PROVIDER_SITE_OTHER)
Admission: RE | Admit: 2015-05-04 | Discharge: 2015-05-04 | Disposition: A | Discharge status: Home / Self Care | Payer: Federal, State, Local not specified - PPO | Source: Ambulatory Visit | Attending: Family | Admitting: Family

## 2015-05-04 ENCOUNTER — Encounter: Payer: Self-pay | Admitting: Family

## 2015-05-04 VITALS — BP 130/80 | HR 85 | Temp 97.7°F | Ht 64.0 in

## 2015-05-04 DIAGNOSIS — M542 Cervicalgia: Secondary | ICD-10-CM

## 2015-05-04 DIAGNOSIS — M6248 Contracture of muscle, other site: Secondary | ICD-10-CM

## 2015-05-04 DIAGNOSIS — M62838 Other muscle spasm: Secondary | ICD-10-CM

## 2015-05-04 MED ORDER — DICLOFENAC SODIUM 1 % TD GEL: 4.0000 g | Freq: Four times a day (QID) | TRANSDERMAL | Status: DC

## 2015-05-04 MED ORDER — CYCLOBENZAPRINE HCL 5 MG PO TABS: 10.0000 mg | ORAL_TABLET | Freq: Every evening | ORAL | Status: DC | PRN

## 2015-05-04 NOTE — Progress Notes (Signed)
Pre visit review using our clinic review tool, if applicable. No additional management support is needed unless otherwise documented below in the visit note. 

## 2015-05-04 NOTE — Patient Instructions (Addendum)
Continue ice and heat on neck.  XR of neck.   Do not drive or operate heavy machinery while on muscle relaxant. Please do not drink alcohol. Only take this medication as needed for acute muscle spasm at bedtime. This medication make you feel drowsy so   be very careful.  Stop taking if become too drowsy or somnolent as this puts you at risk for falls. Please contact our office with any questions.   Schedule CPE.   If there is no improvement in your symptoms, or if there is any worsening of symptoms, or if you have any additional concerns, please return for re-evaluation; or, if we are closed, consider going to the Emergency Room for evaluation if symptoms urgent.

## 2015-05-04 NOTE — Progress Notes (Signed)
Subjective:    Patient ID: Tara [name], female    DOB: 12/15/1955, 60 y.o.   MRN: [id number]   Tara [name] is a 60 y.o. female who presents today for an acute visit.    HPI Comments: Patient here for evaluation of neck pain,  Worsening over past month. Endorses stiffness.In 2003, was having numbness on the left side and was tripping. 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 Since then has had very limited ROM. Second surgery in 2004 relieved some of stiffness and increased ROM.  She has always had pain since the surgery however it hasn't been as severe. Has been taking tyleonol, aleve, and topical magnesium with some relief. 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   Per chart review Cervical XR from 2003 shows anterior fiction plate and screws from C3-C5. Posterior fiction rods and screws C3-C5.   Neck Pain  Associated symptoms include weakness (had regained strength since surgery; still reports 'some weakness on left sifde'). Pertinent negatives include no chest pain, fever, headaches or numbness.   Past Medical History  Diagnosis Date  . Seizures ([place])   . Reactive depression (situational)   . Heart murmur     Birth Defect  . Cervical spine tumor   . Costochondritis   . PE (pulmonary embolism) 2007    Small  . Chest pain 11/07    Neg eval by cath   Cortisone and Oxycodone-acetaminophen Current Outpatient Prescriptions on File Prior to Visit  Medication Sig Dispense Refill  . acetaminophen (TYLENOL) 325 MG tablet Take 650 mg by mouth as needed.       No current facility-administered medications on file prior to visit.    Social History  Substance Use Topics  . Smoking status: Never Smoker   . Smokeless tobacco: Never Used  . Alcohol Use: No    Review of Systems  Constitutional: Negative for fever, chills and unexpected weight change.  HENT: Negative for congestion, ear pain, sinus pressure and sore  throat.   Eyes: Negative for visual disturbance.       Vision getting worse from old age; due for eye exam  Respiratory: Negative for cough, shortness of breath and wheezing.   Cardiovascular: Negative for chest pain, palpitations and leg swelling.  Gastrointestinal: Negative for nausea and vomiting.  Musculoskeletal: Positive for neck pain. Negative for myalgias and arthralgias.  Skin: Negative for rash.  Neurological: Positive for weakness (had regained strength since surgery; still reports 'some weakness on left sifde'). Negative for dizziness, numbness and headaches.  Hematological: Negative for adenopathy.  Psychiatric/Behavioral: Negative for confusion.      Objective:    BP 130/80 mmHg  Pulse 85  Temp(Src) 97.7 F (36.5 C) (Oral)  Ht 5\' 4"  (1.626 m)  SpO2 94%   Physical Exam  Constitutional: She appears well-developed and well-nourished.  Eyes: Conjunctivae are normal.  Neck: Muscular tenderness present. Rigidity present. Decreased range of motion present. No edema and no erythema present.    Midline scar noted posterior. Scar noted anterior left side of neck.No hardware appreciated with palpation. Diffuse posterior muscle tenderness, spasm trapezius. Reduced ROM noted with flexion, extension, and left lateral gaze.    Cardiovascular: Normal rate, regular rhythm, normal heart sounds and normal pulses.   Pulmonary/Chest: Effort normal and breath sounds normal. She has no wheezes. She has no rhonchi. She has no rales.  Neurological: She is alert. She has normal strength. No  cranial nerve deficit or sensory deficit. She displays a negative Romberg sign.  Reflex Scores:      Bicep reflexes are 2+ on the right side and 2+ on the left side.      Patellar reflexes are 2+ on the right side and 2+ on the left side. Grip equal and strong bilateral upper extremities. Gait strong and steady. Able to perform heel-to-shin, finger-to-nose, rapid alternating movement without difficulty.      Skin: Skin is warm and dry.  Psychiatric: She has a normal mood and affect. Her speech is normal and behavior is normal. Thought content normal.  Vitals reviewed.      Assessment & Plan:  1. Neck pain Acute on chronic r/t surgery/hardware. Reassured by normal neurologic exam. - DG Cervical Spine Complete; Future- to eval for hardware stability.  - cyclobenzaprine (FLEXERIL) 5 MG tablet; Take 2 tablets (10 mg total) by mouth at bedtime as needed for muscle spasms.  Dispense: 10 tablet; Refill: 0 - diclofenac sodium (VOLTAREN) 1 % GEL; Apply 4 g topically 4 (four) times daily.  Dispense: 1 Tube; Refill: 3  2. Muscle spasms of neck H/o of opoid addiction. Patient, husband, and I felt comfortable with short , low dose of muscle relaxant.  - cyclobenzaprine (FLEXERIL) 5 MG tablet; Take 2 tablets (10 mg total) by mouth at bedtime as needed for muscle spasms.  Dispense: 10 tablet; Refill: 0 - diclofenac sodium (VOLTAREN) 1 % GEL; Apply 4 g topically 4 (four) times daily.  Dispense: 1 Tube; Refill: 3   I am having Tara [name] start on cyclobenzaprine and diclofenac sodium. I am also having her maintain her acetaminophen.   Meds ordered this encounter  Medications  . cyclobenzaprine (FLEXERIL) 5 MG tablet    Sig: Take 2 tablets (10 mg total) by mouth at bedtime as needed for muscle spasms.    Dispense:  10 tablet    Refill:  0    Order Specific Question:  Supervising Provider    Answer:  Cassandria [name] [[id number]]  . diclofenac sodium (VOLTAREN) 1 % GEL    Sig: Apply 4 g topically 4 (four) times daily.    Dispense:  1 Tube    Refill:  3    Order Specific Question:  Supervising Provider    Answer:  Cassandria [name] [[id number]]     Start medications as prescribed and explained to patient on After Visit Summary ( AVS). Risks, benefits, and alternatives of the medications and treatment plan prescribed today were discussed, and patient expressed understanding.   Education regarding  symptom management and diagnosis given to patient.   Follow-up:Plan follow-up as discussed or as needed if any worsening symptoms or change in condition. Schedule CPE.  Continue to follow with Hoyt Koch, MD for routine health maintenance.   Tara [name] and I agreed with plan.   Mable Paris, FNP

## 2015-07-30 DIAGNOSIS — Z01419 Encounter for gynecological examination (general) (routine) without abnormal findings: Secondary | ICD-10-CM | POA: Diagnosis not present

## 2015-07-30 DIAGNOSIS — Z1272 Encounter for screening for malignant neoplasm of vagina: Secondary | ICD-10-CM | POA: Diagnosis not present

## 2015-08-30 ENCOUNTER — Encounter: Payer: Self-pay | Admitting: Internal Medicine

## 2015-08-30 ENCOUNTER — Ambulatory Visit (INDEPENDENT_AMBULATORY_CARE_PROVIDER_SITE_OTHER): Payer: Federal, State, Local not specified - PPO | Admitting: Internal Medicine

## 2015-08-30 ENCOUNTER — Other Ambulatory Visit (INDEPENDENT_AMBULATORY_CARE_PROVIDER_SITE_OTHER): Payer: Federal, State, Local not specified - PPO

## 2015-08-30 VITALS — BP 136/80 | HR 76 | Temp 97.8°F | Resp 14 | Ht 64.0 in

## 2015-08-30 DIAGNOSIS — R7989 Other specified abnormal findings of blood chemistry: Secondary | ICD-10-CM

## 2015-08-30 DIAGNOSIS — Z1159 Encounter for screening for other viral diseases: Secondary | ICD-10-CM | POA: Diagnosis not present

## 2015-08-30 DIAGNOSIS — Z0000 Encounter for general adult medical examination without abnormal findings: Secondary | ICD-10-CM

## 2015-08-30 DIAGNOSIS — R7301 Impaired fasting glucose: Secondary | ICD-10-CM | POA: Diagnosis not present

## 2015-08-30 LAB — COMPREHENSIVE METABOLIC PANEL
ALT: 59 U/L — ABNORMAL HIGH (ref 0–35)
AST: 32 U/L (ref 0–37)
Albumin: 4.2 g/dL (ref 3.5–5.2)
Alkaline Phosphatase: 83 U/L (ref 39–117)
BUN: 14 mg/dL (ref 6–23)
CALCIUM: 8.8 mg/dL (ref 8.4–10.5)
CHLORIDE: 105 meq/L (ref 96–112)
CO2: 25 meq/L (ref 19–32)
Creatinine, Ser: 0.73 mg/dL (ref 0.40–1.20)
GFR: 86.37 mL/min (ref >60.00)
Glucose, Bld: 109 mg/dL — ABNORMAL HIGH (ref 70–99)
POTASSIUM: 4.3 meq/L (ref 3.5–5.1)
Sodium: 139 mEq/L (ref 135–145)
Total Bilirubin: 0.4 mg/dL (ref 0.2–1.2)
Total Protein: 7.1 g/dL (ref 6.0–8.3)

## 2015-08-30 LAB — LIPID PANEL
CHOLESTEROL: 247 mg/dL — AB (ref 0–200)
HDL: 42.4 mg/dL (ref >39.00)
NonHDL: 204.98
TRIGLYCERIDES: 238 mg/dL — AB (ref 0.0–149.0)
Total CHOL/HDL Ratio: 6
VLDL: 47.6 mg/dL — AB (ref 0.0–40.0)

## 2015-08-30 LAB — TSH: TSH: 1.05 u[IU]/mL (ref 0.35–4.50)

## 2015-08-30 LAB — CBC
HEMATOCRIT: 41.7 % (ref 36.0–46.0)
Hemoglobin: 14.4 g/dL (ref 12.0–15.0)
MCHC: 34.6 g/dL (ref 30.0–36.0)
MCV: 88.5 fl (ref 78.0–100.0)
Platelets: 284 10*3/uL (ref 150.0–400.0)
RBC: 4.71 Mil/uL (ref 3.87–5.11)
RDW: 13.8 % (ref 11.5–15.5)
WBC: 6.8 10*3/uL (ref 4.0–10.5)

## 2015-08-30 LAB — HEPATITIS C ANTIBODY: HCV Ab: NEGATIVE

## 2015-08-30 LAB — HEMOGLOBIN A1C: Hgb A1c MFr Bld: 6.4 % (ref 4.6–6.5)

## 2015-08-30 LAB — LDL CHOLESTEROL, DIRECT: LDL DIRECT: 166 mg/dL

## 2015-08-30 MED ORDER — PHENTERMINE HCL 37.5 MG PO TABS: 37.5000 mg | ORAL_TABLET | Freq: Every day | ORAL | 3 refills | Status: DC

## 2015-08-30 NOTE — Assessment & Plan Note (Signed)
Checking labs, declines shingles and flu shot. She is starting to exercise, giving up some sugar. Declines the colonoscopy at this time but knows she needs it. Agrees to hep c screening but declines hiv screening. Counseled on sun safety and dangers of distracted driving. Given screening recommendations.

## 2015-08-30 NOTE — Progress Notes (Signed)
   Subjective:    Patient ID: Tara [name], female    DOB: Mar 25, 1955, 60 y.o.   MRN: [id number]  HPI  The patient is a 60 YO female coming in for wellness. No new concerns.   PMH, [facility], social history reviewed and updated.   Review of Systems  Constitutional: Positive for unexpected weight change. Negative for activity change, appetite change, chills, fatigue and fever.  HENT: Negative.   Eyes: Negative.   Respiratory: Negative for cough, chest tightness, shortness of breath and wheezing.   Cardiovascular: Negative for chest pain, palpitations and leg swelling.  Gastrointestinal: Negative for abdominal distention, abdominal pain, constipation and diarrhea.  Musculoskeletal: Negative.   Skin: Negative.   Neurological: Negative.   Psychiatric/Behavioral: Negative for behavioral problems, decreased concentration and sleep disturbance. The patient is not nervous/anxious.       Objective:   Physical Exam  Constitutional: She is oriented to person, place, and time. She appears well-developed and well-nourished.  Overweight  HENT:  Head: Normocephalic and atraumatic.  Eyes: EOM are normal.  Neck: Normal range of motion.  Cardiovascular: Normal rate and regular rhythm.   Pulmonary/Chest: Effort normal and breath sounds normal. No respiratory distress. She has no wheezes.  Abdominal: Soft. Bowel sounds are normal. She exhibits no distension. There is no tenderness. There is no rebound.  Musculoskeletal: She exhibits no edema.  Neurological: She is alert and oriented to person, place, and time. Coordination normal.  Skin: Skin is warm and dry.  Psychiatric: She has a normal mood and affect. Her behavior is normal.   Vitals:   08/30/15 1016  BP: 136/80  Pulse: 76  Resp: 14  Temp: 97.8 F (36.6 C)  TempSrc: Oral  SpO2: 96%  Height: 5\' 4"  (1.626 m)      Assessment & Plan:

## 2015-08-30 NOTE — Patient Instructions (Signed)
We have given you the prescription for the weight loss called phentermine. This is a medicine you take with breakfast which helps to increase the metabolism and decrease the hunger.   We would like to see you back in about 3 months to check how you are doing. If you have any problems before then please call us or send a message on mychart.   We are checking the labs today.   Health Maintenance, Female Adopting a healthy lifestyle and getting preventive care can go a long way to promote health and wellness. Talk with your health care provider about what schedule of regular examinations is right for you. This is a good chance for you to check in with your provider about disease prevention and staying healthy. In between checkups, there are plenty of things you can do on your own. Experts have done a lot of research about which lifestyle changes and preventive measures are most likely to keep you healthy. Ask your health care provider for more information. WEIGHT AND DIET  Eat a healthy diet  Be sure to include plenty of vegetables, fruits, low-fat dairy products, and lean protein.  Do not eat a lot of foods high in solid fats, added sugars, or salt.  Get regular exercise. This is one of the most important things you can do for your health.  Most adults should exercise for at least 150 minutes each week. The exercise should increase your heart rate and make you sweat (moderate-intensity exercise).  Most adults should also do strengthening exercises at least twice a week. This is in addition to the moderate-intensity exercise.  Maintain a healthy weight  Body mass index (BMI) is a measurement that can be used to identify possible weight problems. It estimates body fat based on height and weight. Your health care provider can help determine your BMI and help you achieve or maintain a healthy weight.  For females 78 years of age and older:   A BMI below 18.5 is considered underweight.  A BMI  of 18.5 to 24.9 is normal.  A BMI of 25 to 29.9 is considered overweight.  A BMI of 30 and above is considered obese.  Watch levels of cholesterol and blood lipids  You should start having your blood tested for lipids and cholesterol at 60 years of age, then have this test every 5 years.  You may need to have your cholesterol levels checked more often if:  Your lipid or cholesterol levels are high.  You are older than 60 years of age.  You are at high risk for heart disease.  CANCER SCREENING   Lung Cancer  Lung cancer screening is recommended for adults 29-69 years old who are at high risk for lung cancer because of a history of smoking.  A yearly low-dose CT scan of the lungs is recommended for people who:  Currently smoke.  Have quit within the past 15 years.  Have at least a 30-pack-year history of smoking. A pack year is smoking an average of one pack of cigarettes a day for 1 year.  Yearly screening should continue until it has been 15 years since you quit.  Yearly screening should stop if you develop a health problem that would prevent you from having lung cancer treatment.  Breast Cancer  Practice breast self-awareness. This means understanding how your breasts normally appear and feel.  It also means doing regular breast self-exams. Let your health care provider know about any changes, no matter how small.  If you are in your 20s or 30s, you should have a clinical breast exam (CBE) by a health care provider every 1-3 years as part of a regular health exam.  If you are 45 or older, have a CBE every year. Also consider having a breast X-ray (mammogram) every year.  If you have a family history of breast cancer, talk to your health care provider about genetic screening.  If you are at high risk for breast cancer, talk to your health care provider about having an MRI and a mammogram every year.  Breast cancer gene (BRCA) assessment is recommended for women who  have family members with BRCA-related cancers. BRCA-related cancers include:  Breast.  Ovarian.  Tubal.  Peritoneal cancers.  Results of the assessment will determine the need for genetic counseling and BRCA1 and BRCA2 testing. Cervical Cancer Your health care provider may recommend that you be screened regularly for cancer of the pelvic organs (ovaries, uterus, and vagina). This screening involves a pelvic examination, including checking for microscopic changes to the surface of your cervix (Pap test). You may be encouraged to have this screening done every 3 years, beginning at age 55.  For women ages 48-65, health care providers may recommend pelvic exams and Pap testing every 3 years, or they may recommend the Pap and pelvic exam, combined with testing for human papilloma virus (HPV), every 5 years. Some types of HPV increase your risk of cervical cancer. Testing for HPV may also be done on women of any age with unclear Pap test results.  Other health care providers may not recommend any screening for nonpregnant women who are considered low risk for pelvic cancer and who do not have symptoms. Ask your health care provider if a screening pelvic exam is right for you.  If you have had past treatment for cervical cancer or a condition that could lead to cancer, you need Pap tests and screening for cancer for at least 20 years after your treatment. If Pap tests have been discontinued, your risk factors (such as having a new sexual partner) need to be reassessed to determine if screening should resume. Some women have medical problems that increase the chance of getting cervical cancer. In these cases, your health care provider may recommend more frequent screening and Pap tests. Colorectal Cancer  This type of cancer can be detected and often prevented.  Routine colorectal cancer screening usually begins at 60 years of age and continues through 60 years of age.  Your health care provider  may recommend screening at an earlier age if you have risk factors for colon cancer.  Your health care provider may also recommend using home test kits to check for hidden blood in the stool.  A small camera at the end of a tube can be used to examine your colon directly (sigmoidoscopy or colonoscopy). This is done to check for the earliest forms of colorectal cancer.  Routine screening usually begins at age 91.  Direct examination of the colon should be repeated every 5-10 years through 60 years of age. However, you may need to be screened more often if early forms of precancerous polyps or small growths are found. Skin Cancer  Check your skin from head to toe regularly.  Tell your health care provider about any new moles or changes in moles, especially if there is a change in a mole's shape or color.  Also tell your health care provider if you have a mole that is larger than the  size of a pencil eraser.  Always use sunscreen. Apply sunscreen liberally and repeatedly throughout the day.  Protect yourself by wearing long sleeves, pants, a wide-brimmed hat, and sunglasses whenever you are outside. HEART DISEASE, DIABETES, AND HIGH BLOOD PRESSURE   High blood pressure causes heart disease and increases the risk of stroke. High blood pressure is more likely to develop in:  People who have blood pressure in the high end of the normal range (130-139/85-89 mm Hg).  People who are overweight or obese.  People who are African American.  If you are 99-11 years of age, have your blood pressure checked every 3-5 years. If you are 43 years of age or older, have your blood pressure checked every year. You should have your blood pressure measured twice--once when you are at a hospital or clinic, and once when you are not at a hospital or clinic. Record the average of the two measurements. To check your blood pressure when you are not at a hospital or clinic, you can use:  An automated blood  pressure machine at a pharmacy.  A home blood pressure monitor.  If you are between 22 years and 69 years old, ask your health care provider if you should take aspirin to prevent strokes.  Have regular diabetes screenings. This involves taking a blood sample to check your fasting blood sugar level.  If you are at a normal weight and have a low risk for diabetes, have this test once every three years after 60 years of age.  If you are overweight and have a high risk for diabetes, consider being tested at a younger age or more often. PREVENTING INFECTION  Hepatitis B  If you have a higher risk for hepatitis B, you should be screened for this virus. You are considered at high risk for hepatitis B if:  You were born in a country where hepatitis B is common. Ask your health care provider which countries are considered high risk.  Your parents were born in a high-risk country, and you have not been immunized against hepatitis B (hepatitis B vaccine).  You have HIV or AIDS.  You use needles to inject street drugs.  You live with someone who has hepatitis B.  You have had sex with someone who has hepatitis B.  You get hemodialysis treatment.  You take certain medicines for conditions, including cancer, organ transplantation, and autoimmune conditions. Hepatitis C  Blood testing is recommended for:  Everyone born from [birth year] through 1965.  Anyone with known risk factors for hepatitis C. Sexually transmitted infections (STIs)  You should be screened for sexually transmitted infections (STIs) including gonorrhea and chlamydia if:  You are sexually active and are younger than 60 years of age.  You are older than 60 years of age and your health care provider tells you that you are at risk for this type of infection.  Your sexual activity has changed since you were last screened and you are at an increased risk for chlamydia or gonorrhea. Ask your health care provider if you are at  risk.  If you do not have HIV, but are at risk, it may be recommended that you take a prescription medicine daily to prevent HIV infection. This is called pre-exposure prophylaxis (PrEP). You are considered at risk if:  You are sexually active and do not regularly use condoms or know the HIV status of your partner(s).  You take drugs by injection.  You are sexually active with a partner who  has HIV. Talk with your health care provider about whether you are at high risk of being infected with HIV. If you choose to begin PrEP, you should first be tested for HIV. You should then be tested every 3 months for as long as you are taking PrEP.  PREGNANCY   If you are premenopausal and you may become pregnant, ask your health care provider about preconception counseling.  If you may become pregnant, take 400 to 800 micrograms (mcg) of folic acid every day.  If you want to prevent pregnancy, talk to your health care provider about birth control (contraception). OSTEOPOROSIS AND MENOPAUSE   Osteoporosis is a disease in which the bones lose minerals and strength with aging. This can result in serious bone fractures. Your risk for osteoporosis can be identified using a bone density scan.  If you are 52 years of age or older, or if you are at risk for osteoporosis and fractures, ask your health care provider if you should be screened.  Ask your health care provider whether you should take a calcium or vitamin D supplement to lower your risk for osteoporosis.  Menopause may have certain physical symptoms and risks.  Hormone replacement therapy may reduce some of these symptoms and risks. Talk to your health care provider about whether hormone replacement therapy is right for you.  HOME CARE INSTRUCTIONS   Schedule regular health, dental, and eye exams.  Stay current with your immunizations.   Do not use any tobacco products including cigarettes, chewing tobacco, or electronic cigarettes.  If  you are pregnant, do not drink alcohol.  If you are breastfeeding, limit how much and how often you drink alcohol.  Limit alcohol intake to no more than 1 drink per day for nonpregnant women. One drink equals 12 ounces of beer, 5 ounces of wine, or 1 ounces of hard liquor.  Do not use street drugs.  Do not share needles.  Ask your health care provider for help if you need support or information about quitting drugs.  Tell your health care provider if you often feel depressed.  Tell your health care provider if you have ever been abused or do not feel safe at home.   This information is not intended to replace advice given to you by your health care provider. Make sure you discuss any questions you have with your health care provider.   Document Released: 07/11/2010 Document Revised: 01/16/2014 Document Reviewed: 11/27/2012 Elsevier Interactive Patient Education [employer].

## 2015-08-30 NOTE — Progress Notes (Signed)
Pre visit review using our clinic review tool, if applicable. No additional management support is needed unless otherwise documented below in the visit note. 

## 2015-08-30 NOTE — Assessment & Plan Note (Signed)
Checking HgA1c, adjust as needed. She is giving up some sugary foods and mild exercise.

## 2015-11-29 ENCOUNTER — Encounter: Payer: Self-pay | Admitting: Internal Medicine

## 2015-11-29 ENCOUNTER — Ambulatory Visit (INDEPENDENT_AMBULATORY_CARE_PROVIDER_SITE_OTHER): Payer: Federal, State, Local not specified - PPO | Admitting: Internal Medicine

## 2015-11-29 ENCOUNTER — Other Ambulatory Visit (INDEPENDENT_AMBULATORY_CARE_PROVIDER_SITE_OTHER): Payer: Federal, State, Local not specified - PPO

## 2015-11-29 VITALS — BP 122/78 | HR 71 | Temp 98.3°F | Ht 64.0 in | Wt 215.0 lb

## 2015-11-29 DIAGNOSIS — R635 Abnormal weight gain: Secondary | ICD-10-CM | POA: Diagnosis not present

## 2015-11-29 LAB — T4, FREE: Free T4: 0.75 ng/dL (ref 0.60–1.60)

## 2015-11-29 MED ORDER — PHENTERMINE HCL 37.5 MG PO TABS: 37.5000 mg | ORAL_TABLET | Freq: Every day | ORAL | 3 refills | Status: DC

## 2015-11-29 NOTE — Progress Notes (Signed)
Pre visit review using our clinic review tool, if applicable. No additional management support is needed unless otherwise documented below in the visit note. 

## 2015-11-29 NOTE — Patient Instructions (Signed)
We will check the thyroid levels today and send you the results.   We have refilled the phentermine.

## 2015-11-29 NOTE — Assessment & Plan Note (Signed)
Checking free T4 today. Rx refilled for phentermine. We do not have starting weight but compared to last year she is still up some weight. She reports clothes fitting better and would like to continue. BP normal and no side effects.

## 2015-11-29 NOTE — Progress Notes (Signed)
   Subjective:    Patient ID: Tara [name], female    DOB: 17-Sep-1955, 60 y.o.   MRN: [id number]  HPI The patient is a 60 YO female coming in for follow up of her weight. She started phentermine about 3 months ago. She is having less cravings and more energy. She denies any side effects. No palpitations. No nausea or vomiting. She did not weigh before starting phentermine and did not want to know her weight now. Feels like clothes are fitting some better.   Review of Systems  Constitutional: Positive for appetite change. Negative for activity change, fatigue, fever and unexpected weight change.  Respiratory: Negative.   Cardiovascular: Negative.   Gastrointestinal: Negative.   Musculoskeletal: Negative.   Skin: Negative.       Objective:   Physical Exam  Constitutional: She is oriented to person, place, and time. She appears well-developed and well-nourished.  HENT:  Head: Normocephalic and atraumatic.  Eyes: EOM are normal.  Neck: Normal range of motion.  Cardiovascular: Normal rate and regular rhythm.   Pulmonary/Chest: Effort normal and breath sounds normal. No respiratory distress. She has no wheezes. She has no rales.  Abdominal: Soft. She exhibits no distension. There is no tenderness. There is no rebound.  Neurological: She is alert and oriented to person, place, and time.  Skin: Skin is warm and dry.   Vitals:   11/29/15 0951  BP: 122/78  Pulse: 71  Temp: 98.3 F (36.8 C)  TempSrc: Oral  SpO2: 98%  Weight: 215 lb (97.5 kg)  Height: 5\' 4"  (1.626 m)      Assessment & Plan:

## 2016-02-29 ENCOUNTER — Encounter: Payer: Self-pay | Admitting: Internal Medicine

## 2016-02-29 ENCOUNTER — Ambulatory Visit (INDEPENDENT_AMBULATORY_CARE_PROVIDER_SITE_OTHER): Payer: Federal, State, Local not specified - PPO | Admitting: Internal Medicine

## 2016-02-29 DIAGNOSIS — R635 Abnormal weight gain: Secondary | ICD-10-CM | POA: Diagnosis not present

## 2016-02-29 MED ORDER — PHENTERMINE HCL 37.5 MG PO TABS: 37.5000 mg | ORAL_TABLET | Freq: Every day | ORAL | 3 refills | Status: DC

## 2016-02-29 NOTE — Assessment & Plan Note (Signed)
Refill phentermine for another 3 months. See her back in 3 months and adjust as needed. Weight is stabilized at this time and she is working to adjust other habits and dietary changes to help more.

## 2016-02-29 NOTE — Progress Notes (Signed)
Pre visit review using our clinic review tool, if applicable. No additional management support is needed unless otherwise documented below in the visit note. 

## 2016-02-29 NOTE — Progress Notes (Signed)
   Subjective:    Patient ID: Tara [name], female    DOB: July 17, 1955, 60 y.o.   MRN: [id number]  HPI The patient is a 61 YO female coming in for follow up of her weight on phentermine. She is still feeling as though her clothes are getting looser. She does not weigh at home. She is noticing still less appetite and slightly more energy with the medicine. No side effects that bother her. No heart racing, headaches. She is wanting to continue for another 3 months or so. They just bought a new mattress which will hopefully help with her sleep.   Review of Systems  Constitutional: Positive for appetite change. Negative for activity change, chills, diaphoresis, fever and unexpected weight change.  Respiratory: Negative.   Cardiovascular: Negative.   Gastrointestinal: Negative.   Musculoskeletal: Negative.   Neurological: Negative.       Objective:   Physical Exam  Constitutional: She is oriented to person, place, and time. She appears well-developed and well-nourished.  HENT:  Head: Normocephalic and atraumatic.  Eyes: EOM are normal.  Neck: Normal range of motion.  Cardiovascular: Normal rate and regular rhythm.   Pulmonary/Chest: Effort normal. No respiratory distress. She has no wheezes. She has no rales.  Abdominal: Soft.  Neurological: She is alert and oriented to person, place, and time.  Skin: Skin is warm and dry.   Vitals:   02/29/16 0945  BP: (!) 138/98  Pulse: 84  Temp: 98.7 F (37.1 C)  TempSrc: Oral  SpO2: 99%  Weight: 216 lb (98 kg)  Height: 5\' 4"  (1.626 m)      Assessment & Plan:

## 2016-02-29 NOTE — Patient Instructions (Signed)
We have refilled the phentermine to take for another 3 months.

## 2016-03-14 ENCOUNTER — Encounter: Payer: Self-pay | Admitting: Nurse Practitioner

## 2016-03-14 ENCOUNTER — Ambulatory Visit (INDEPENDENT_AMBULATORY_CARE_PROVIDER_SITE_OTHER): Payer: Federal, State, Local not specified - PPO | Admitting: Nurse Practitioner

## 2016-03-14 VITALS — HR 76 | Temp 97.6°F

## 2016-03-14 DIAGNOSIS — S39012A Strain of muscle, fascia and tendon of lower back, initial encounter: Secondary | ICD-10-CM | POA: Diagnosis not present

## 2016-03-14 MED ORDER — CYCLOBENZAPRINE HCL 5 MG PO TABS: 5.0000 mg | ORAL_TABLET | Freq: Every day | ORAL | 0 refills | Status: DC

## 2016-03-14 MED ORDER — PREDNISONE 20 MG PO TABS: 40.0000 mg | ORAL_TABLET | Freq: Every day | ORAL | 0 refills | Status: DC

## 2016-03-14 NOTE — Progress Notes (Signed)
Subjective:  Patient ID: Tara [name], female    DOB: [birth year]  Age: 61 y.o. MRN: [id number]  CC: Back Pain (Started on Friday after helping daughter move a piece of furniture. Pt states that the pain is in the lower back. ) and BP Check (Not able to get cuff tight enough. Will you please check. )   Back Pain  This is a new problem. The current episode started in the past 7 days. The problem occurs constantly. The problem has been gradually worsening since onset. The pain is present in the lumbar spine. The quality of the pain is described as aching and cramping. The pain does not radiate. The symptoms are aggravated by twisting (changing position). Pertinent negatives include no abdominal pain, bladder incontinence, bowel incontinence, dysuria, fever, leg pain, numbness, paresis, paresthesias, pelvic pain, perianal numbness, tingling, weakness or weight loss. Risk factors include sedentary lifestyle, obesity, menopause, lack of exercise and poor posture (onset after pushing dresser). She has tried NSAIDs for the symptoms. The treatment provided mild relief.    Outpatient Medications Prior to Visit  Medication Sig Dispense Refill  . acetaminophen (TYLENOL) 325 MG tablet Take 650 mg by mouth as needed.      Marland Kitchen b complex vitamins tablet Take 1 tablet by mouth daily.    . Magnesium 200 MG TABS Take by mouth.    . Omega-3 1000 MG CAPS Take by mouth.    . phentermine (ADIPEX-P) 37.5 MG tablet Take 1 tablet (37.5 mg total) by mouth daily before breakfast. 30 tablet 3  . Polyethyl Glycol-Propyl Glycol (SYSTANE OP) Apply to eye.    Marland Kitchen Pumpkin Seed-Soy Germ (AZO BLADDER CONTROL/GO-LESS PO) Take by mouth. One daily    . TURMERIC PO Take by mouth.     No facility-administered medications prior to visit.     ROS See HPI  Objective:  Pulse 76   Temp 97.6 F (36.4 C) (Oral)   SpO2 97%   BP Readings from Last 3 Encounters:  02/29/16 (!) 138/98  11/29/15 122/78  08/30/15 136/80    Wt  Readings from Last 3 Encounters:  02/29/16 216 lb (98 kg)  11/29/15 215 lb (97.5 kg)  02/24/14 211 lb (95.7 kg)    Physical Exam  Constitutional: She is oriented to person, place, and time. No distress.  Cardiovascular: Normal rate.   Abdominal: Soft. Bowel sounds are normal.  Musculoskeletal: She exhibits tenderness. She exhibits no edema or deformity.       Right hip: Normal.       Left hip: Normal.       Lumbar back: She exhibits decreased range of motion, tenderness, pain and spasm. She exhibits no bony tenderness.  Negative straight leg raise.  Neurological: She is alert and oriented to person, place, and time.  Skin: Skin is warm and dry. No rash noted. No erythema.  Vitals reviewed.   Lab Results  Component Value Date   WBC 6.8 08/30/2015   HGB 14.4 08/30/2015   HCT 41.7 08/30/2015   PLT 284.0 08/30/2015   GLUCOSE 109 (H) 08/30/2015   CHOL 247 (H) 08/30/2015   TRIG 238.0 (H) 08/30/2015   HDL 42.40 08/30/2015   LDLDIRECT 166.0 08/30/2015   ALT 59 (H) 08/30/2015   AST 32 08/30/2015   NA 139 08/30/2015   K 4.3 08/30/2015   CL 105 08/30/2015   CREATININE 0.73 08/30/2015   BUN 14 08/30/2015   CO2 25 08/30/2015   TSH 1.05 08/30/2015   HGBA1C  6.4 08/30/2015    Dg Cervical Spine Complete  Result Date: 05/04/2015 CLINICAL DATA:  Neck and left arm pain without known injury. EXAM: CERVICAL SPINE - COMPLETE 4+ VIEW COMPARISON:  None available currently. FINDINGS: Status post surgical anterior fusion of C3, C4-C5. Status post surgical posterior fusion of the right-sided posterior facet joints at the same levels. No fracture or spondylolisthesis is noted. Neural foraminal stenosis on the left cannot be excluded due to overlying surgical hardware. No definite neural foraminal stenosis is noted on the right. IMPRESSION: Extensive postsurgical changes as described above. No acute fracture or significant spondylolisthesis is noted. Electronically Signed   By: Marijo Conception, M.D.    On: 05/04/2015 15:46    Assessment & Plan:   Tara [name] was seen today for back pain and bp check.  Diagnoses and all orders for this visit:  Lumbar strain, initial encounter -     cyclobenzaprine (FLEXERIL) 5 MG tablet; Take 1 tablet (5 mg total) by mouth at bedtime. -     predniSONE (DELTASONE) 20 MG tablet; Take 2 tablets (40 mg total) by mouth daily with breakfast.   I am having Tara [name] start on cyclobenzaprine and predniSONE. I am also having her maintain her acetaminophen, Polyethyl Glycol-Propyl Glycol (SYSTANE OP), Pumpkin Seed-Soy Germ (AZO BLADDER CONTROL/GO-LESS PO), b complex vitamins, Omega-3, TURMERIC PO, Magnesium, and phentermine.  Meds ordered this encounter  Medications  . cyclobenzaprine (FLEXERIL) 5 MG tablet    Sig: Take 1 tablet (5 mg total) by mouth at bedtime.    Dispense:  14 tablet    Refill:  0    Order Specific Question:   Supervising Provider    Answer:   Cassandria [name] [[id number]]  . predniSONE (DELTASONE) 20 MG tablet    Sig: Take 2 tablets (40 mg total) by mouth daily with breakfast.    Dispense:  6 tablet    Refill:  0    Order Specific Question:   Supervising Provider    Answer:   Cassandria [name] [[id number]]    Follow-up: Return if symptoms worsen or fail to improve.  Wilfred Lacy, NP

## 2016-03-14 NOTE — Progress Notes (Signed)
Pre visit review using our clinic review tool, if applicable. No additional management support is needed unless otherwise documented below in the visit note. 

## 2016-03-14 NOTE — Patient Instructions (Signed)
She is aware of possible adverse reaction with prednisone use, but insist of taking only oral prednisone and no NSAIDs at this time.  Muscle Strain A muscle strain (pulled muscle) happens when a muscle is stretched beyond normal length. It happens when a sudden, violent force stretches your muscle too far. Usually, a few of the fibers in your muscle are torn. Muscle strain is common in athletes. Recovery usually takes 1-2 weeks. Complete healing takes 5-6 weeks. Follow these instructions at home:  Follow the PRICE method of treatment to help your injury get better. Do this the first 2-3 days after the injury:  Protect. Protect the muscle to keep it from getting injured again.  Rest. Limit your activity and rest the injured body part.  Ice. Put ice in a plastic bag. Place a towel between your skin and the bag. Then, apply the ice and leave it on from 15-20 minutes each hour. After the third day, switch to moist heat packs.  Compression. Use a splint or elastic bandage on the injured area for comfort. Do not put it on too tightly.  Elevate. Keep the injured body part above the level of your heart.  Only take medicine as told by your doctor.  Warm up before doing exercise to prevent future muscle strains. Contact a doctor if:  You have more pain or puffiness (swelling) in the injured area.  You feel numbness, tingling, or notice a loss of strength in the injured area. This information is not intended to replace advice given to you by your health care provider. Make sure you discuss any questions you have with your health care provider. Document Released: 10/05/2007 Document Revised: 06/03/2015 Document Reviewed: 07/25/2012 Elsevier Interactive Patient Education  2017 [employer].

## 2016-03-16 ENCOUNTER — Telehealth: Payer: Self-pay | Admitting: Internal Medicine

## 2016-03-16 ENCOUNTER — Telehealth: Payer: Self-pay | Admitting: Nurse Practitioner

## 2016-03-16 DIAGNOSIS — S39012A Strain of muscle, fascia and tendon of lower back, initial encounter: Secondary | ICD-10-CM

## 2016-03-16 MED ORDER — PREDNISONE 20 MG PO TABS: ORAL_TABLET | ORAL | 0 refills | Status: DC

## 2016-03-16 NOTE — Telephone Encounter (Signed)
error 

## 2016-03-16 NOTE — Telephone Encounter (Signed)
Patient called in and stated she is no better and her back still hurts. She wants 3 more days of prednisone called in.  I spoke with Baldo Ash and she said she would. But if patient is no better she will need to come back in and be seen.   Thank you Baldo Ash.

## 2016-05-29 ENCOUNTER — Ambulatory Visit: Payer: Federal, State, Local not specified - PPO | Admitting: Internal Medicine

## 2016-06-20 ENCOUNTER — Ambulatory Visit: Payer: Federal, State, Local not specified - PPO | Admitting: Internal Medicine

## 2016-06-26 ENCOUNTER — Ambulatory Visit (INDEPENDENT_AMBULATORY_CARE_PROVIDER_SITE_OTHER): Payer: Federal, State, Local not specified - PPO | Admitting: Family

## 2016-06-26 ENCOUNTER — Encounter: Payer: Self-pay | Admitting: Family

## 2016-06-26 VITALS — BP 142/88 | HR 73 | Temp 99.0°F | Resp 18 | Ht 64.0 in | Wt 215.0 lb

## 2016-06-26 DIAGNOSIS — R635 Abnormal weight gain: Secondary | ICD-10-CM

## 2016-06-26 NOTE — Progress Notes (Signed)
Subjective:    Patient ID: Tara [name], female    DOB: 1955-07-31, 61 y.o.   MRN: [id number]  Chief Complaint  Patient presents with  . Medication Management    wants to talk about phentermine states she has not seen any changes since taking it and she has been eating alot less, wants to try something else    HPI:  Tara [name] is a 61 y.o. female who  has a past medical history of Cervical spine tumor; Chest pain (11/07); Costochondritis; Heart murmur; PE (pulmonary embolism) (2007); Reactive depression (situational); and Seizures ([place]). and presents today for a follow up office visit.  Weight management - Currently maintained on Phentermine and reports taking the medication as prescribed and denies adverse side effects. Has not lost any week. 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 Has never tried any of the weight loss programs in the past. The obesity has been going on for about 5 years. Does have a history of impaired fasting glucose.  Wt Readings from Last 3 Encounters:  06/26/16 215 lb (97.5 kg)  02/29/16 216 lb (98 kg)  11/29/15 215 lb (97.5 kg)     Allergies  Allergen Reactions  . Cortisone     REACTION: Migraines  . Oxycodone-Acetaminophen     REACTION: causes migraines      Outpatient Medications Prior to Visit  Medication Sig Dispense Refill  . acetaminophen (TYLENOL) 325 MG tablet Take 650 mg by mouth as needed.      Marland Kitchen b complex vitamins tablet Take 1 tablet by mouth daily.    . Magnesium 200 MG TABS Take by mouth.    . Omega-3 1000 MG CAPS Take by mouth.    . phentermine (ADIPEX-P) 37.5 MG tablet Take 1 tablet (37.5 mg total) by mouth daily before breakfast. 30 tablet 3  . Pumpkin Seed-Soy Germ (AZO BLADDER CONTROL/GO-LESS PO) Take by mouth. One daily    . TURMERIC PO Take by mouth.    . cyclobenzaprine (FLEXERIL) 5 MG tablet Take 1  tablet (5 mg total) by mouth at bedtime. 14 tablet 0  . Polyethyl Glycol-Propyl Glycol (SYSTANE OP) Apply to eye.    . predniSONE (DELTASONE) 20 MG tablet Take 3tabs on day 1, 2tabs on day 2, 1tab on day 3, then stop. Take with food 7 tablet 0   No facility-administered medications prior to visit.      Review of Systems  Constitutional: Negative for chills and fever.  Respiratory: Negative for chest tightness and shortness of breath.       Objective:    BP (!) 142/88 (BP Location: Right Arm, Patient Position: Sitting, Cuff Size: Large)   Pulse 73   Temp 99 F (37.2 C) (Oral)   Resp 18   Ht 5\' 4"  (1.626 m)   Wt 215 lb (97.5 kg)   SpO2 93%   BMI 36.90 kg/m  Nursing note and vital signs reviewed.  Physical Exam  Constitutional: She is oriented to person, place, and time. She appears well-developed and well-nourished. No distress.  Cardiovascular: Normal rate, regular rhythm, normal heart sounds and intact distal pulses.   Pulmonary/Chest: Effort normal and breath sounds normal.  Neurological: She is alert and oriented to person, place, and time.  Skin: Skin is warm and dry.  Psychiatric: She has a normal mood and affect. Her behavior is normal. Judgment and thought  content normal.       Assessment & Plan:   Problem List Items Addressed This Visit      Other   Abnormal weight gain - Primary    No significant improvement with previously prescribed phentermine. Discussed importance of consuming a lower caloric diet that is rich in nutrient dense foods and low in saturated fats and processed sugary foods. Recommend a 1200-1500 calorie diet focused on protein intake. Educated regarding medications as well as weight loss programs including Weight Watchers, Merryl Hacker and Nutrisystem. She does inquire about possible bariatric surgery. Discontinue phentermine. Focus on lifestyle choices and behaviors. Follow up in 1 month or sooner if needed.           I have discontinued  Tara [name]'s Polyethyl Glycol-Propyl Glycol (SYSTANE OP), cyclobenzaprine, and predniSONE. I am also having her maintain her acetaminophen, Pumpkin Seed-Soy Germ (AZO BLADDER CONTROL/GO-LESS PO), b complex vitamins, Omega-3, TURMERIC PO, Magnesium, and phentermine.   Follow-up: Return in about 1 month (around 07/26/2016), or if symptoms worsen or fail to improve.  Mauricio Po, FNP

## 2016-06-26 NOTE — Assessment & Plan Note (Signed)
No significant improvement with previously prescribed phentermine. Discussed importance of consuming a lower caloric diet that is rich in nutrient dense foods and low in saturated fats and processed sugary foods. Recommend a 1200-1500 calorie diet focused on protein intake. Educated regarding medications as well as weight loss programs including Weight Watchers, Merryl Hacker and Nutrisystem. She does inquire about possible bariatric surgery. Discontinue phentermine. Focus on lifestyle choices and behaviors. Follow up in 1 month or sooner if needed.

## 2016-06-26 NOTE — Patient Instructions (Addendum)
Thank you for choosing [facility].  SUMMARY AND INSTRUCTIONS:  Recommend eating a caloric intake of about 1200-1500 calories per day emphasizing protein similar to a Mediterranian type diet as described below.   Increase physical to goal of 30 minutes of moderate level activity or about 10,000 steps per day.  Consider evaluation of weight watchers, Merryl Hacker or Nutrisystem.   Follow up:  If your symptoms worsen or fail to improve, please contact our office for further instruction, or in case of emergency go directly to the emergency room at the closest medical facility.       Why follow it? Research shows. . Those who follow the Mediterranean diet have a reduced risk of heart disease  . The diet is associated with a reduced incidence of Parkinson's and Alzheimer's diseases . People following the diet may have longer life expectancies and lower rates of chronic diseases  . The Dietary Guidelines for Americans recommends the Mediterranean diet as an eating plan to promote health and prevent disease  What Is the Mediterranean Diet?  . Healthy eating plan based on typical foods and recipes of Mediterranean-style cooking . The diet is primarily a plant based diet; these foods should make up a majority of meals   Starches - Plant based foods should make up a majority of meals - They are an important sources of vitamins, minerals, energy, antioxidants, and fiber - Choose whole grains, foods high in fiber and minimally processed items  - Typical grain sources include wheat, oats, barley, corn, brown rice, bulgar, farro, millet, polenta, couscous  - Various types of beans include chickpeas, lentils, fava beans, black beans, white beans   Fruits  Veggies - Large quantities of antioxidant rich fruits & veggies; 6 or more servings  - Vegetables can be eaten raw or lightly drizzled with oil and cooked  - Vegetables common to the traditional Mediterranean Diet include: artichokes,  arugula, beets, broccoli, brussel sprouts, cabbage, carrots, celery, collard greens, cucumbers, eggplant, kale, leeks, lemons, lettuce, mushrooms, okra, onions, peas, peppers, potatoes, pumpkin, radishes, rutabaga, shallots, spinach, sweet potatoes, turnips, zucchini - Fruits common to the Mediterranean Diet include: apples, apricots, avocados, cherries, clementines, dates, figs, grapefruits, grapes, melons, nectarines, oranges, peaches, pears, pomegranates, strawberries, tangerines  Fats - Replace butter and margarine with healthy oils, such as olive oil, canola oil, and tahini  - Limit nuts to no more than a handful a day  - Nuts include walnuts, almonds, pecans, pistachios, pine nuts  - Limit or avoid candied, honey roasted or heavily salted nuts - Olives are central to the [employer] - can be eaten whole or used in a variety of dishes   Meats Protein - Limiting red meat: no more than a few times a month - When eating red meat: choose lean cuts and keep the portion to the size of deck of cards - Eggs: approx. 0 to 4 times a week  - Fish and lean poultry: at least 2 a week  - Healthy protein sources include, chicken, [place], lean beef, lamb - Increase intake of seafood such as tuna, salmon, trout, mackerel, shrimp, scallops - Avoid or limit high fat processed meats such as sausage and bacon  Dairy - Include moderate amounts of low fat dairy products  - Focus on healthy dairy such as fat free yogurt, skim milk, low or reduced fat cheese - Limit dairy products higher in fat such as whole or 2% milk, cheese, ice cream  Alcohol - Moderate amounts of red  wine is ok  - No more than 5 oz daily for women (all ages) and men older than age 24  - No more than 10 oz of wine daily for men younger than 81  Other - Limit sweets and other desserts  - Use herbs and spices instead of salt to flavor foods  - Herbs and spices common to the traditional Mediterranean Diet include: basil, bay leaves,  chives, cloves, cumin, fennel, garlic, lavender, marjoram, mint, oregano, parsley, pepper, rosemary, sage, savory, sumac, tarragon, thyme   It's not just a diet, it's a lifestyle:  . The Mediterranean diet includes lifestyle factors typical of those in the region  . Foods, drinks and meals are best eaten with others and savored . Daily physical activity is important for overall good health . This could be strenuous exercise like running and aerobics . This could also be more leisurely activities such as walking, housework, yard-work, or taking the stairs . Moderation is the key; a balanced and healthy diet accommodates most foods and drinks . Consider portion sizes and frequency of consumption of certain foods   Meal Ideas & Options:  . Breakfast:  o Whole wheat toast or whole wheat English muffins with peanut butter & hard boiled egg o Steel cut oats topped with apples & cinnamon and skim milk  o Fresh fruit: banana, strawberries, melon, berries, peaches  o Smoothies: strawberries, bananas, greek yogurt, peanut butter o Low fat greek yogurt with blueberries and granola  o Egg white omelet with spinach and mushrooms o Breakfast couscous: whole wheat couscous, apricots, skim milk, cranberries  . Sandwiches:  o Hummus and grilled vegetables (peppers, zucchini, squash) on whole wheat bread   o Grilled chicken on whole wheat pita with lettuce, tomatoes, cucumbers or tzatziki  o Tuna salad on whole wheat bread: tuna salad made with greek yogurt, olives, red peppers, capers, green onions o Garlic rosemary lamb pita: lamb sauted with garlic, rosemary, salt & pepper; add lettuce, cucumber, greek yogurt to pita - flavor with lemon juice and black pepper  . Seafood:  o Mediterranean grilled salmon, seasoned with garlic, basil, parsley, lemon juice and black pepper o Shrimp, lemon, and spinach whole-grain pasta salad made with low fat greek yogurt  o Seared scallops with lemon orzo  o Seared tuna  steaks seasoned salt, pepper, coriander topped with tomato mixture of olives, tomatoes, olive oil, minced garlic, parsley, green onions and cappers  . Meats:  o Herbed greek chicken salad with kalamata olives, cucumber, feta  o Red bell peppers stuffed with spinach, bulgur, lean ground beef (or lentils) & topped with feta   o Kebabs: skewers of chicken, tomatoes, onions, zucchini, squash  o [place] burgers: made with red onions, mint, dill, lemon juice, feta cheese topped with roasted red peppers . Vegetarian o Cucumber salad: cucumbers, artichoke hearts, celery, red onion, feta cheese, tossed in olive oil & lemon juice  o Hummus and whole grain pita points with a greek salad (lettuce, tomato, feta, olives, cucumbers, red onion) o Lentil soup with celery, carrots made with vegetable broth, garlic, salt and pepper  o Tabouli salad: parsley, bulgur, mint, scallions, cucumbers, tomato, radishes, lemon juice, olive oil, salt and pepper.

## 2016-09-22 DIAGNOSIS — Z1231 Encounter for screening mammogram for malignant neoplasm of breast: Secondary | ICD-10-CM | POA: Diagnosis not present

## 2016-09-22 DIAGNOSIS — Z1272 Encounter for screening for malignant neoplasm of vagina: Secondary | ICD-10-CM | POA: Diagnosis not present

## 2016-09-22 DIAGNOSIS — Z01419 Encounter for gynecological examination (general) (routine) without abnormal findings: Secondary | ICD-10-CM | POA: Diagnosis not present

## 2016-09-22 LAB — HM PAP SMEAR

## 2016-09-26 LAB — HM MAMMOGRAPHY

## 2017-05-11 DIAGNOSIS — K080 Exfoliation of teeth due to systemic causes: Secondary | ICD-10-CM | POA: Diagnosis not present

## 2017-10-09 ENCOUNTER — Telehealth: Payer: Self-pay | Admitting: Internal Medicine

## 2017-10-09 NOTE — Telephone Encounter (Signed)
Copied from [place] [id number]. Topic: General - Other >> Oct 09, 2017 11:12 AM Janace Aris A wrote: Patient called in wanting to inquire on her referral request for Dr. Sharlet Salina to be her son's PCP. Says she would like to know if this is okay.   Please advise and call patient back.

## 2017-10-18 ENCOUNTER — Encounter: Payer: Self-pay | Admitting: Internal Medicine

## 2017-10-18 DIAGNOSIS — Z01419 Encounter for gynecological examination (general) (routine) without abnormal findings: Secondary | ICD-10-CM | POA: Diagnosis not present

## 2017-10-18 NOTE — Progress Notes (Unsigned)
Abstracted and sent to scan  

## 2017-10-18 NOTE — Progress Notes (Signed)
Abstracted and sent to scan  

## 2017-11-16 DIAGNOSIS — K080 Exfoliation of teeth due to systemic causes: Secondary | ICD-10-CM | POA: Diagnosis not present

## 2018-03-12 ENCOUNTER — Telehealth: Payer: Self-pay

## 2018-03-12 DIAGNOSIS — H9193 Unspecified hearing loss, bilateral: Secondary | ICD-10-CM

## 2018-03-12 NOTE — Telephone Encounter (Signed)
Copied from [place] [phone]. Topic: Referral - Request for Referral >> Mar 12, 2018 10:10 AM Leward Quan A wrote: Has patient seen PCP for this complaint? Yes.   *If NO, is insurance requiring patient see PCP for this issue before PCP can refer them? Referral for which specialty: Audiology  Preferred provider/office: [facility]  Fax# [phone] Reason for referral: Check hearing

## 2018-03-13 NOTE — Addendum Note (Signed)
Addended by: Raford Pitcher R on: 03/13/2018 11:52 AM   Modules accepted: Orders

## 2018-03-13 NOTE — Telephone Encounter (Signed)
Okay to place? 

## 2018-03-28 ENCOUNTER — Encounter: Payer: Self-pay | Admitting: Obstetrics and Gynecology

## 2019-01-27 DIAGNOSIS — Z01419 Encounter for gynecological examination (general) (routine) without abnormal findings: Secondary | ICD-10-CM | POA: Diagnosis not present

## 2019-01-27 DIAGNOSIS — Z1231 Encounter for screening mammogram for malignant neoplasm of breast: Secondary | ICD-10-CM | POA: Diagnosis not present

## 2019-05-09 ENCOUNTER — Telehealth: Payer: Self-pay | Admitting: Internal Medicine

## 2019-05-09 NOTE — Telephone Encounter (Signed)
Fine

## 2019-05-09 NOTE — Telephone Encounter (Signed)
    Patient calling to request transfer of care appointment Patient currently Dr Sharlet Salina would like to become a patient at [facility], Dr Yong Channel or any other MD

## 2019-05-20 NOTE — Telephone Encounter (Signed)
Patient has been scheduled since she hasn't been seen in over 3 years she will be a new patient.

## 2019-08-18 ENCOUNTER — Other Ambulatory Visit: Payer: Self-pay

## 2019-08-18 ENCOUNTER — Encounter: Payer: Self-pay | Admitting: Family Medicine

## 2019-08-18 ENCOUNTER — Ambulatory Visit: Payer: Federal, State, Local not specified - PPO | Admitting: Family Medicine

## 2019-08-18 VITALS — BP 142/82 | HR 71 | Temp 98.7°F | Ht 64.0 in

## 2019-08-18 DIAGNOSIS — D492 Neoplasm of unspecified behavior of bone, soft tissue, and skin: Secondary | ICD-10-CM | POA: Insufficient documentation

## 2019-08-18 DIAGNOSIS — R030 Elevated blood-pressure reading, without diagnosis of hypertension: Secondary | ICD-10-CM

## 2019-08-18 DIAGNOSIS — Z1159 Encounter for screening for other viral diseases: Secondary | ICD-10-CM | POA: Diagnosis not present

## 2019-08-18 DIAGNOSIS — Z114 Encounter for screening for human immunodeficiency virus [HIV]: Secondary | ICD-10-CM | POA: Diagnosis not present

## 2019-08-18 DIAGNOSIS — Z0000 Encounter for general adult medical examination without abnormal findings: Secondary | ICD-10-CM | POA: Diagnosis not present

## 2019-08-18 DIAGNOSIS — Z86711 Personal history of pulmonary embolism: Secondary | ICD-10-CM | POA: Insufficient documentation

## 2019-08-18 NOTE — Progress Notes (Signed)
Patient: Tara [name] MRN: [id number] DOB: [birth year] PCP: Orma Flaming, MD     Subjective:  Chief Complaint  Patient presents with  . Establish Care  . Annual Exam    HPI: The patient is a 64 y.o. female who presents today for annual exam. She denies any changes to past medical history. There have been no recent hospitalizations. She is  following a well balanced diet, she eats  plenty of fruits and vegetables, but no exercise plan due to taking care of her husband providing around the clock care. Weight has been stable. Pt says that she has been trying to lose weight.  She also says that she has urine frequency, but she thinks it is a psychological issue.   No first degree relative with breast cancer. Her sister had ovarian cancer, but it was capsulated. She has been tested (patient).   She has increased urinary frequency and urgency. Her gyn put her on azo. Likely more incontinence. She has been checked for infection by her gyn and is fine.   Immunization History  Administered Date(s) Administered  . Janssen (J&J) SARS-COV-2 Vaccination 04/15/2019  . Tdap 02/24/2014   Colonoscopy: never had this done.  Mammogram: 01/2019 Pap smear: 2018, but s/p hysterectomy    Review of Systems  Constitutional: Negative for chills, fatigue and fever.  HENT: Negative for dental problem, ear pain, hearing loss and trouble swallowing.   Eyes: Negative for visual disturbance.  Respiratory: Negative for cough, chest tightness and shortness of breath.   Cardiovascular: Negative for chest pain, palpitations and leg swelling.  Gastrointestinal: Negative for abdominal pain, blood in stool, diarrhea, nausea and vomiting.  Endocrine: Negative for cold intolerance, polydipsia, polyphagia and polyuria.  Genitourinary: Positive for frequency and urgency. Negative for dysuria and hematuria.  Musculoskeletal: Negative for arthralgias.  Skin: Negative for rash.  Neurological: Negative for dizziness,  light-headedness and headaches.  Psychiatric/Behavioral: Negative for dysphoric mood and sleep disturbance. The patient is not nervous/anxious.     Allergies Patient is allergic to cortisone and oxycodone-acetaminophen.  Past Medical History Patient  has a past medical history of Cervical spine tumor, Chest pain (11/07), Costochondritis, Heart murmur, PE (pulmonary embolism) (2007), Reactive depression (situational), and Seizures ([place]).  Surgical History Patient  has a past surgical history that includes Cesarean section (1982); Surgical Repair for Displaced Rod Fixation (2003); REMOVAL OF BENIGN TUMOR - CERVICAL SPINE; and Abdominal hysterectomy (2007).  Family History Pateint's family history includes Breast cancer in her paternal aunt; Cancer in her sister; Colon cancer in her paternal aunt; Coronary artery disease in her father; Depression in her brother, brother, and sister; Diabetes in her brother, brother, brother, and father; Drug abuse in her brother; Heart attack in her brother and brother; Heart disease in her brother; Hyperlipidemia in her brother and father; Hypertension in her brother and father; Kidney disease in her brother; Kidney failure in her daughter; Miscarriages / [place] in her father.  Social History Patient  reports that she has never smoked. She has never used smokeless tobacco. She reports that she does not drink alcohol and does not use drugs.    Objective: Vitals:   08/18/19 1317 08/18/19 1355  BP: (!) 148/86 (!) 142/82  Pulse: 71   Temp: 98.7 F (37.1 C)   TempSrc: Temporal   SpO2: 95%   Height: 5\' 4"  (1.626 m)     Body mass index is 36.9 kg/m.  Physical Exam Vitals reviewed.  Constitutional:      Appearance: Normal appearance.  She is well-developed. She is obese.  HENT:     Head: Normocephalic and atraumatic.     Right Ear: Tympanic membrane, ear canal and external ear normal.     Left Ear: Tympanic membrane, ear canal and external ear  normal.     Nose: Nose normal.     Mouth/Throat:     Mouth: Mucous membranes are moist.  Eyes:     Extraocular Movements: Extraocular movements intact.     Conjunctiva/sclera: Conjunctivae normal.     Pupils: Pupils are equal, round, and reactive to light.  Neck:     Thyroid: No thyromegaly.  Cardiovascular:     Rate and Rhythm: Normal rate and regular rhythm.     Heart sounds: Normal heart sounds. No murmur heard.   Pulmonary:     Effort: Pulmonary effort is normal.     Breath sounds: Normal breath sounds.  Abdominal:     General: Abdomen is flat. Bowel sounds are normal. There is no distension.     Palpations: Abdomen is soft.     Tenderness: There is no abdominal tenderness.  Musculoskeletal:     Cervical back: Normal range of motion and neck supple.  Lymphadenopathy:     Cervical: No cervical adenopathy.  Skin:    General: Skin is warm and dry.     Capillary Refill: Capillary refill takes less than 2 seconds.     Findings: No rash.  Neurological:     General: No focal deficit present.     Mental Status: She is alert and oriented to person, place, and time.     Cranial Nerves: No cranial nerve deficit.     Coordination: Coordination normal.     Deep Tendon Reflexes: Reflexes normal.  Psychiatric:        Mood and Affect: Mood normal.        Behavior: Behavior normal.          Office Visit from 08/18/2019 in [place]  PHQ-2 Total Score 0      Assessment/plan: 1. Annual physical exam Routine fasting labs today. Hm reviewed. She does need a colonoscopy, but needs to wait at this time due to taking care of husband. Really encouraged weight loss and diet. F/u in one year or as needed.  Patient counseling [x]    Nutrition: Stressed importance of moderation in sodium/caffeine intake, saturated fat and cholesterol, caloric balance, sufficient intake of fresh fruits, vegetables, fiber, calcium, iron, and 1 mg of folate supplement per day (for  females capable of pregnancy).  [x]    Stressed the importance of regular exercise.   []    Substance Abuse: Discussed cessation/primary prevention of tobacco, alcohol, or other drug use; driving or other dangerous activities under the influence; availability of treatment for abuse.   [x]    Injury prevention: Discussed safety belts, safety helmets, smoke detector, smoking near bedding or upholstery.   [x]    Sexuality: Discussed sexually transmitted diseases, partner selection, use of condoms, avoidance of unintended pregnancy  and contraceptive alternatives.  [x]    Dental health: Discussed importance of regular tooth brushing, flossing, and dental visits.  [x]    Health maintenance and immunizations reviewed. Please refer to Health maintenance section.     - CBC with Differential/Platelet; Future - Comprehensive metabolic panel; Future - Hemoglobin A1c; Future - TSH; Future - Lipid panel; Future  2. Morbid obesity ([place])  - Amb Ref to Medical Weight Management  3. Elevated blood pressure reading Repeat is borderline. Referring to weight loss clinic  and hopefully with weight loss and exercise we can bring this down. Blood pressure will be monitored and if stays consistently elevated we will need to begin medication.   4. Encounter for hepatitis C screening test for low risk patient   5. Encounter for screening for HIV  - HIV Antibody (routine testing w rflx); Future   Going through a really hard season with her husband who has PD. Palliative care just called, but she is sole caretaker. She is emotionally doing okay now and denies any depression, but discussed if she is struggling I want her to come back, especially as her husband continues to decline.   This visit occurred during the SARS-CoV-2 public health emergency.  Safety protocols were in place, including screening questions prior to the visit, additional usage of staff PPE, and extensive cleaning of exam room while observing  appropriate contact time as indicated for disinfecting solutions.     Return in about 1 year (around 08/17/2020).     Orma Flaming, MD [place]  08/18/2019

## 2019-08-18 NOTE — Patient Instructions (Signed)
So nice to meet you.  Referral to healthy weight and wellness, they will call you with this.   I don't need to see you for a year.   Preventive Care 20-64 Years Old, Female Preventive care refers to visits with your health care provider and lifestyle choices that can promote health and wellness. This includes:  A yearly physical exam. This may also be called an annual well check.  Regular dental visits and eye exams.  Immunizations.  Screening for certain conditions.  Healthy lifestyle choices, such as eating a healthy diet, getting regular exercise, not using drugs or products that contain nicotine and tobacco, and limiting alcohol use. What can I expect for my preventive care visit? Physical exam Your health care provider will check your:  Height and weight. This may be used to calculate body mass index (BMI), which tells if you are at a healthy weight.  Heart rate and blood pressure.  Skin for abnormal spots. Counseling Your health care provider may ask you questions about your:  Alcohol, tobacco, and drug use.  Emotional well-being.  Home and relationship well-being.  Sexual activity.  Eating habits.  Work and work Statistician.  Method of birth control.  Menstrual cycle.  Pregnancy history. What immunizations do I need?  Influenza (flu) vaccine  This is recommended every year. Tetanus, diphtheria, and pertussis (Tdap) vaccine  You may need a Td booster every 10 years. Varicella (chickenpox) vaccine  You may need this if you have not been vaccinated. Zoster (shingles) vaccine  You may need this after age 69. Measles, mumps, and rubella (MMR) vaccine  You may need at least one dose of MMR if you were born in 1957 or later. You may also need a second dose. Pneumococcal conjugate (PCV13) vaccine  You may need this if you have certain conditions and were not previously vaccinated. Pneumococcal polysaccharide (PPSV23) vaccine  You may need one or two  doses if you smoke cigarettes or if you have certain conditions. Meningococcal conjugate (MenACWY) vaccine  You may need this if you have certain conditions. Hepatitis A vaccine  You may need this if you have certain conditions or if you travel or work in places where you may be exposed to hepatitis A. Hepatitis B vaccine  You may need this if you have certain conditions or if you travel or work in places where you may be exposed to hepatitis B. Haemophilus influenzae type b (Hib) vaccine  You may need this if you have certain conditions. Human papillomavirus (HPV) vaccine  If recommended by your health care provider, you may need three doses over 6 months. You may receive vaccines as individual doses or as more than one vaccine together in one shot (combination vaccines). Talk with your health care provider about the risks and benefits of combination vaccines. What tests do I need? Blood tests  Lipid and cholesterol levels. These may be checked every 5 years, or more frequently if you are over 82 years old.  Hepatitis C test.  Hepatitis B test. Screening  Lung cancer screening. You may have this screening every year starting at age 18 if you have a 30-pack-year history of smoking and currently smoke or have quit within the past 15 years.  Colorectal cancer screening. All adults should have this screening starting at age 67 and continuing until age 33. Your health care provider may recommend screening at age 24 if you are at increased risk. You will have tests every 1-10 years, depending on your results  and the type of screening test.  Diabetes screening. This is done by checking your blood sugar (glucose) after you have not eaten for a while (fasting). You may have this done every 1-3 years.  Mammogram. This may be done every 1-2 years. Talk with your health care provider about when you should start having regular mammograms. This may depend on whether you have a family history of  breast cancer.  BRCA-related cancer screening. This may be done if you have a family history of breast, ovarian, tubal, or peritoneal cancers.  Pelvic exam and Pap test. This may be done every 3 years starting at age 63. Starting at age 73, this may be done every 5 years if you have a Pap test in combination with an HPV test. Other tests  Sexually transmitted disease (STD) testing.  Bone density scan. This is done to screen for osteoporosis. You may have this scan if you are at high risk for osteoporosis. Follow these instructions at home: Eating and drinking  Eat a diet that includes fresh fruits and vegetables, whole grains, lean protein, and low-fat dairy.  Take vitamin and mineral supplements as recommended by your health care provider.  Do not drink alcohol if: ? Your health care provider tells you not to drink. ? You are pregnant, may be pregnant, or are planning to become pregnant.  If you drink alcohol: ? Limit how much you have to 0-1 drink a day. ? Be aware of how much alcohol is in your drink. In the [place] one drink equals one 12 oz bottle of beer (355 mL), one 5 oz glass of wine (148 mL), or one 1 oz glass of hard liquor (44 mL). Lifestyle  Take daily care of your teeth and gums.  Stay active. Exercise for at least 30 minutes on 5 or more days each week.  Do not use any products that contain nicotine or tobacco, such as cigarettes, e-cigarettes, and chewing tobacco. If you need help quitting, ask your health care provider.  If you are sexually active, practice safe sex. Use a condom or other form of birth control (contraception) in order to prevent pregnancy and STIs (sexually transmitted infections).  If told by your health care provider, take low-dose aspirin daily starting at age 70. What's next?  Visit your health care provider once a year for a well check visit.  Ask your health care provider how often you should have your eyes and teeth checked.  Stay up to  date on all vaccines. This information is not intended to replace advice given to you by your health care provider. Make sure you discuss any questions you have with your health care provider. Document Revised: 09/06/2017 Document Reviewed: 09/06/2017 Elsevier Patient Education  2020 [employer].

## 2019-08-19 LAB — COMPREHENSIVE METABOLIC PANEL
AG Ratio: 1.9 (calc) (ref 1.0–2.5)
ALT: 24 U/L (ref 6–29)
AST: 16 U/L (ref 10–35)
Albumin: 4.4 g/dL (ref 3.6–5.1)
Alkaline phosphatase (APISO): 79 U/L (ref 37–153)
BUN: 15 mg/dL (ref 7–25)
CO2: 25 mmol/L (ref 20–32)
Calcium: 9.2 mg/dL (ref 8.6–10.4)
Chloride: 106 mmol/L (ref 98–110)
Creat: 0.71 mg/dL (ref 0.50–0.99)
Globulin: 2.3 g/dL (calc) (ref 1.9–3.7)
Glucose, Bld: 102 mg/dL — ABNORMAL HIGH (ref 65–99)
Potassium: 3.9 mmol/L (ref 3.5–5.3)
Sodium: 141 mmol/L (ref 135–146)
Total Bilirubin: 0.5 mg/dL (ref 0.2–1.2)
Total Protein: 6.7 g/dL (ref 6.1–8.1)

## 2019-08-19 LAB — CBC WITH DIFFERENTIAL/PLATELET
Absolute Monocytes: 608 cells/uL (ref 200–950)
Basophils Absolute: 32 cells/uL (ref 0–200)
Basophils Relative: 0.4 %
Eosinophils Absolute: 72 cells/uL (ref 15–500)
Eosinophils Relative: 0.9 %
HCT: 42.7 % (ref 35.0–45.0)
Hemoglobin: 14.6 g/dL (ref 11.7–15.5)
Lymphs Abs: 1776 cells/uL (ref 850–3900)
MCH: 30.7 pg (ref 27.0–33.0)
MCHC: 34.2 g/dL (ref 32.0–36.0)
MCV: 89.7 fL (ref 80.0–100.0)
MPV: 10 fL (ref 7.5–12.5)
Monocytes Relative: 7.6 %
Neutro Abs: 5512 cells/uL (ref 1500–7800)
Neutrophils Relative %: 68.9 %
Platelets: 248 10*3/uL (ref 140–400)
RBC: 4.76 10*6/uL (ref 3.80–5.10)
RDW: 12.6 % (ref 11.0–15.0)
Total Lymphocyte: 22.2 %
WBC: 8 10*3/uL (ref 3.8–10.8)

## 2019-08-19 LAB — LIPID PANEL
Cholesterol: 248 mg/dL — ABNORMAL HIGH (ref <200)
HDL: 49 mg/dL — ABNORMAL LOW (ref >=50)
LDL Cholesterol (Calc): 165 mg/dL (calc) — ABNORMAL HIGH
Non-HDL Cholesterol (Calc): 199 mg/dL (calc) — ABNORMAL HIGH (ref <130)
Total CHOL/HDL Ratio: 5.1 (calc) — ABNORMAL HIGH (ref <5.0)
Triglycerides: 181 mg/dL — ABNORMAL HIGH (ref <150)

## 2019-08-19 LAB — HEMOGLOBIN A1C
Hgb A1c MFr Bld: 6.8 % of total Hgb — ABNORMAL HIGH (ref <5.7)
Mean Plasma Glucose: 148 (calc)
eAG (mmol/L): 8.2 (calc)

## 2019-08-19 LAB — HIV ANTIBODY (ROUTINE TESTING W REFLEX): HIV 1&2 Ab, 4th Generation: NONREACTIVE

## 2019-08-19 LAB — TSH: TSH: 1.38 mIU/L (ref 0.40–4.50)

## 2019-08-21 ENCOUNTER — Other Ambulatory Visit: Payer: Self-pay | Admitting: Family Medicine

## 2019-08-21 ENCOUNTER — Encounter: Payer: Self-pay | Admitting: Family Medicine

## 2019-08-21 MED ORDER — ROSUVASTATIN CALCIUM 10 MG PO TABS
10.0000 mg | ORAL_TABLET | Freq: Every day | ORAL | 3 refills | Status: DC
Start: 2019-08-21 — End: 2020-10-05

## 2019-08-26 DIAGNOSIS — D3122 Benign neoplasm of left retina: Secondary | ICD-10-CM | POA: Diagnosis not present

## 2019-08-26 DIAGNOSIS — D3121 Benign neoplasm of right retina: Secondary | ICD-10-CM | POA: Diagnosis not present

## 2019-11-28 ENCOUNTER — Encounter (INDEPENDENT_AMBULATORY_CARE_PROVIDER_SITE_OTHER): Payer: Self-pay

## 2020-10-05 ENCOUNTER — Encounter: Payer: Self-pay | Admitting: Family Medicine

## 2020-10-05 ENCOUNTER — Other Ambulatory Visit: Payer: Self-pay

## 2020-10-05 ENCOUNTER — Ambulatory Visit (INDEPENDENT_AMBULATORY_CARE_PROVIDER_SITE_OTHER): Payer: Medicare Other | Admitting: Family Medicine

## 2020-10-05 VITALS — BP 132/80 | HR 80 | Temp 97.6°F | Ht 64.0 in

## 2020-10-05 DIAGNOSIS — Z13220 Encounter for screening for lipoid disorders: Secondary | ICD-10-CM | POA: Diagnosis not present

## 2020-10-05 DIAGNOSIS — R030 Elevated blood-pressure reading, without diagnosis of hypertension: Secondary | ICD-10-CM | POA: Diagnosis not present

## 2020-10-05 DIAGNOSIS — E119 Type 2 diabetes mellitus without complications: Secondary | ICD-10-CM | POA: Diagnosis not present

## 2020-10-05 DIAGNOSIS — F439 Reaction to severe stress, unspecified: Secondary | ICD-10-CM

## 2020-10-05 NOTE — Progress Notes (Signed)
Tara [name] is a 65 y.o. female who presents today for an office visit.  Assessment/Plan:  Chronic Problems Addressed Today: Controlled type 2 diabetes mellitus without complication, without long-term current use of insulin (HCC) Check A1c and labs.  Elevated blood pressure reading Elevated today.  Has been under a lot of stress recently.  Blood pressures are typically well controlled.  Check labs today.  She will continue home monitoring and let me know if persistently 150/90 or higher.  Stress Has been under quite a bit of life stress recently.  She has been working with the bereavement therapist with hospice due to the passing of her husband.  She does not feel as though she needs any extra assistance at this point.  She will let me know if things worsen or if she would like to start medications or be referred to see a therapist.  Preventative health care Check labs.  Follows with gynecology for women's health.  Due for colon cancer screening. Deferred flu vaccine.     Subjective:  HPI:  She is here to transfer of care. Her previous PCP no longer works at this office.   Her blood pressure at office was elevated today. She notes it is usually in the 130's range. She had dental appointment yesterday for deep cleaning. She reports her heart raced after receiving the numbing injection. It lasted only for few seconds. She is concerned it might be causing the high blood pressure.   She is under a lot of stress recently. Her daughter was diagnosed with breast cancer and husband died few months ago. She feel fatigue most of the time. In addition to this, she complain of tingling and numbness in her finger. She is concerned it could be diabetes. She has a family hx of diabetes in her brother and father. She would like get blood work done today.   She continues having neck pain. In 2003, she reports she was having numbness on the left side  of the neck. MRI was done which showed tumor  on spine. She had neck surgery done in 2003 for tumor. She notes she has  always had pain since then. She has been taking magnesium and other medication with no side effects.  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   Controlled type 2 diabetes mellitus without complication, without long-term current use of insulin ([place]) 01/08/2013   SEIZURES, HX OF 04/06/2007   Past Surgical History:  Procedure Laterality Date   ABDOMINAL HYSTERECTOMY  2007   CESAREAN SECTION  1982   REMOVAL OF BENIGN TUMOR - CERVICAL SPINE     Surgical Repair for Displaced Rod Fixation  2003    Family History  Problem Relation Age of Onset   37 / Stillbirths Father    Hypertension Father    Hyperlipidemia Father    Coronary artery disease Father    Diabetes Father    Heart attack Brother  Depression Brother    Diabetes Brother    Hypertension Brother    Kidney disease Brother    Diabetes Brother        Severe complication including renal failure, amputation   Drug abuse Brother    Heart disease Brother    Heart attack Brother    Kidney failure Daughter    Colon cancer Paternal Aunt    Breast cancer Paternal 55    Cancer Sister    Depression Sister    Depression Brother    Diabetes Brother    Hyperlipidemia Brother     Medications- reviewed and updated Current Outpatient Medications  Medication Sig Dispense Refill   b complex vitamins tablet Take 1 tablet by mouth daily.     clindamycin (CLINDAGEL) 1 % gel       famotidine-calcium carbonate-magnesium hydroxide (PEPCID COMPLETE) 10-800-165 MG chewable tablet Chew 1 tablet by mouth daily as needed.     Magnesium 200 MG TABS Take by mouth.     Omega-3 1000 MG CAPS Take by mouth.     TURMERIC PO Take by mouth.     acetaminophen (TYLENOL) 325 MG tablet Take 650 mg by mouth as needed.   (Patient not taking: Reported on 10/05/2020)     No current facility-administered medications for this visit.    Allergies-reviewed and updated Allergies  Allergen Reactions   Cortisone     REACTION: Migraines   Oxycodone-Acetaminophen     REACTION: causes migraines    Social History   Socioeconomic History   Marital status: Married    Spouse name: Not on file   Number of children: Not on file   Years of education: Not on file   Highest education level: Not on file  Occupational History   Occupation: Financial trader: [employer]  Tobacco Use   Smoking status: Never   Smokeless tobacco: Never  Substance and Sexual Activity   Alcohol use: No   Drug use: No   Sexual activity: Yes  Other Topics Concern   Not on file  [place]   Married 1977   2 daughters - '82, '85; 1 son - '91   Social Determinants of Radio broadcast assistant Strain: Not on Art therapist Insecurity: Not on file  Transportation Needs: Not on file  Physical Activity: Not on file  Stress: Not on file  Social Connections: Not on file            Objective:  Physical Exam: BP (!) 164/88   Pulse 80   Temp 97.6 F (36.4 C) (Temporal)   Ht 5\' 4"  (1.626 m)   SpO2 95%   BMI 36.90 kg/m   Gen: No acute distress, resting comfortably CV: Regular rate and rhythm with no murmurs appreciated Pulm: Normal work of breathing, clear to auscultation bilaterally with no crackles, wheezes, or rhonchi Neuro: Grossly normal, moves all extremities Psych: Normal affect and thought content       I,Savera Zaman,acting as a  scribe for Dimas Chyle, MD.,have documented all relevant documentation on the behalf of Dimas Chyle, MD,as directed by  Dimas Chyle, MD while in the presence of Dimas Chyle, MD.   I, Dimas Chyle, MD, have reviewed all documentation for this visit. The documentation on 10/05/20 for the exam, diagnosis, procedures, and orders are all accurate and complete.  Time Spent: 45 minutes of total time was spent on the date of  the encounter performing the following actions: chart review prior to seeing the patient including recent visit from previous PCP, obtaining history, performing a medically necessary exam, counseling on the treatment plan, placing orders, and documenting in our EHR.     Algis Greenhouse. Jerline Pain, MD 10/05/2020 3:32 PM

## 2020-10-05 NOTE — Patient Instructions (Signed)
It was very nice to see you today!  We will check blood work today.  Continue working on diet and exercise.  Please keep an eye on your blood pressure readings let us know if it is persistently elevated to 150/90 or higher.  I will see you back in year for your next Annual checkup.  Please come back to see me sooner if needed.  Take care, Dr Jerline Pain  PLEASE NOTE:  If you had any lab tests please let us know if you have not heard back within a few days. You may see your results on mychart before we have a chance to review them but we will give you a call once they are reviewed by [place]. If we ordered any referrals today, please let us know if you have not heard from their office within the next week.   Please try these tips to maintain a healthy lifestyle:  Eat at least 3 REAL meals and 1-2 snacks per day.  Aim for no more than 5 hours between eating.  If you eat breakfast, please do so within one hour of getting up.   Each meal should contain half fruits/vegetables, one quarter protein, and one quarter carbs (no bigger than a computer mouse)  Cut down on sweet beverages. This includes juice, soda, and sweet tea.   Drink at least 1 glass of water with each meal and aim for at least 8 glasses per day  Exercise at least 150 minutes every week.

## 2020-10-05 NOTE — Assessment & Plan Note (Signed)
Has been under quite a bit of life stress recently.  She has been working with the bereavement therapist with hospice due to the passing of her husband.  She does not feel as though she needs any extra assistance at this point.  She will let me know if things worsen or if she would like to start medications or be referred to see a therapist.

## 2020-10-05 NOTE — Assessment & Plan Note (Signed)
Check A1c and labs.

## 2020-10-05 NOTE — Assessment & Plan Note (Signed)
Elevated today.  Has been under a lot of stress recently.  Blood pressures are typically well controlled.  Check labs today.  She will continue home monitoring and let me know if persistently 150/90 or higher.

## 2020-10-06 LAB — MICROALBUMIN / CREATININE URINE RATIO
Creatinine,U: 158.1 mg/dL
Microalb Creat Ratio: 0.4 mg/g (ref 0.0–30.0)
Microalb, Ur: 0.7 mg/dL (ref 0.0–1.9)

## 2020-10-06 LAB — LIPID PANEL
Cholesterol: 268 mg/dL — ABNORMAL HIGH (ref 0–200)
HDL: 45 mg/dL (ref >39.00)
NonHDL: 223.29
Total CHOL/HDL Ratio: 6
Triglycerides: 286 mg/dL — ABNORMAL HIGH (ref 0.0–149.0)
VLDL: 57.2 mg/dL — ABNORMAL HIGH (ref 0.0–40.0)

## 2020-10-06 LAB — CBC
HCT: 43.4 % (ref 36.0–46.0)
Hemoglobin: 14.5 g/dL (ref 12.0–15.0)
MCHC: 33.5 g/dL (ref 30.0–36.0)
MCV: 90.3 fl (ref 78.0–100.0)
Platelets: 240 10*3/uL (ref 150.0–400.0)
RBC: 4.8 Mil/uL (ref 3.87–5.11)
RDW: 13.8 % (ref 11.5–15.5)
WBC: 8.1 10*3/uL (ref 4.0–10.5)

## 2020-10-06 LAB — COMPREHENSIVE METABOLIC PANEL
ALT: 79 U/L — ABNORMAL HIGH (ref 0–35)
AST: 54 U/L — ABNORMAL HIGH (ref 0–37)
Albumin: 4.3 g/dL (ref 3.5–5.2)
Alkaline Phosphatase: 83 U/L (ref 39–117)
BUN: 15 mg/dL (ref 6–23)
CO2: 25 mEq/L (ref 19–32)
Calcium: 9.2 mg/dL (ref 8.4–10.5)
Chloride: 106 mEq/L (ref 96–112)
Creatinine, Ser: 0.93 mg/dL (ref 0.40–1.20)
GFR: 64.59 mL/min (ref >60.00)
Glucose, Bld: 152 mg/dL — ABNORMAL HIGH (ref 70–99)
Potassium: 3.8 mEq/L (ref 3.5–5.1)
Sodium: 142 mEq/L (ref 135–145)
Total Bilirubin: 0.4 mg/dL (ref 0.2–1.2)
Total Protein: 7.2 g/dL (ref 6.0–8.3)

## 2020-10-06 LAB — LDL CHOLESTEROL, DIRECT: Direct LDL: 186 mg/dL

## 2020-10-06 LAB — HEMOGLOBIN A1C: Hgb A1c MFr Bld: 7.7 % — ABNORMAL HIGH (ref 4.6–6.5)

## 2020-10-06 LAB — TSH: TSH: 1.11 u[IU]/mL (ref 0.35–5.50)

## 2020-10-11 NOTE — Progress Notes (Signed)
Please inform patient of the following:  Her cholesterol and blood sugar are above goal. We do not have to start medications at this point however she should continue to work hard on diet and exercise.  I would like to see her back in 3 months to recheck A1c.

## 2020-11-11 ENCOUNTER — Telehealth: Payer: Self-pay

## 2020-11-11 NOTE — Telephone Encounter (Signed)
Please advise 

## 2020-11-11 NOTE — Telephone Encounter (Signed)
Patient called in wanting to know when this month should she get the omicron booster. Patient is worried about her health because her daughter is having surgery next week and Tara [name] will be in the waiting room of the surgical center. Tara [name] is asking if it would be okay to schedule the vaccine this week?

## 2020-11-12 NOTE — Telephone Encounter (Signed)
Patient aware of information. 

## 2020-11-12 NOTE — Telephone Encounter (Signed)
It is ok for her to schedule the vaccine this week.  Algis Greenhouse. Jerline Pain, MD 11/12/2020 9:05 AM

## 2021-01-24 ENCOUNTER — Telehealth: Payer: Self-pay

## 2021-01-24 NOTE — Telephone Encounter (Signed)
States she never had a shingles vac due to having shingles.  States her daughter has just been diagnosed with shingles.  Patient would like a call back with advise on precautions she might need to take.

## 2021-01-25 NOTE — Telephone Encounter (Signed)
Patient stated had shingles in the past and wanted to know if she still need to get shingles vaccine  Per Dr Jerline Pain advise to still have shingles vaccine  Patient verbalized understanding

## 2021-01-25 NOTE — Telephone Encounter (Signed)
Patient calling back to f/u on the message below.

## 2021-01-26 ENCOUNTER — Encounter: Payer: Self-pay | Admitting: Family Medicine

## 2021-01-26 NOTE — Telephone Encounter (Signed)
Vaccine records updated.

## 2021-04-07 ENCOUNTER — Other Ambulatory Visit: Payer: Self-pay | Admitting: Obstetrics and Gynecology

## 2021-04-07 DIAGNOSIS — R928 Other abnormal and inconclusive findings on diagnostic imaging of breast: Secondary | ICD-10-CM

## 2021-04-26 ENCOUNTER — Ambulatory Visit
Admission: RE | Admit: 2021-04-26 | Discharge: 2021-04-26 | Disposition: A | Discharge status: Home / Self Care | Payer: Medicare Other | Source: Ambulatory Visit | Attending: Obstetrics and Gynecology | Admitting: Obstetrics and Gynecology

## 2021-04-26 DIAGNOSIS — R928 Other abnormal and inconclusive findings on diagnostic imaging of breast: Secondary | ICD-10-CM

## 2021-04-26 LAB — HM MAMMOGRAPHY

## 2021-05-02 ENCOUNTER — Encounter: Payer: Self-pay | Admitting: Family Medicine

## 2021-06-09 DIAGNOSIS — N6459 Other signs and symptoms in breast: Secondary | ICD-10-CM | POA: Insufficient documentation

## 2021-08-17 ENCOUNTER — Encounter (INDEPENDENT_AMBULATORY_CARE_PROVIDER_SITE_OTHER): Payer: Self-pay

## 2021-09-23 ENCOUNTER — Ambulatory Visit (INDEPENDENT_AMBULATORY_CARE_PROVIDER_SITE_OTHER): Payer: Medicare Other | Admitting: Family Medicine

## 2021-09-23 VITALS — BP 132/80 | HR 78 | Temp 98.6°F | Resp 96 | Ht 64.0 in | Wt 219.8 lb

## 2021-09-23 DIAGNOSIS — N6459 Other signs and symptoms in breast: Secondary | ICD-10-CM | POA: Diagnosis not present

## 2021-09-23 DIAGNOSIS — F439 Reaction to severe stress, unspecified: Secondary | ICD-10-CM

## 2021-09-23 DIAGNOSIS — E559 Vitamin D deficiency, unspecified: Secondary | ICD-10-CM | POA: Diagnosis not present

## 2021-09-23 DIAGNOSIS — R030 Elevated blood-pressure reading, without diagnosis of hypertension: Secondary | ICD-10-CM

## 2021-09-23 DIAGNOSIS — E119 Type 2 diabetes mellitus without complications: Secondary | ICD-10-CM

## 2021-09-23 DIAGNOSIS — E538 Deficiency of other specified B group vitamins: Secondary | ICD-10-CM | POA: Diagnosis not present

## 2021-09-23 LAB — COMPREHENSIVE METABOLIC PANEL
ALT: 71 U/L — ABNORMAL HIGH (ref 0–35)
AST: 33 U/L (ref 0–37)
Albumin: 4 g/dL (ref 3.5–5.2)
Alkaline Phosphatase: 104 U/L (ref 39–117)
BUN: 10 mg/dL (ref 6–23)
CO2: 27 mEq/L (ref 19–32)
Calcium: 9.2 mg/dL (ref 8.4–10.5)
Chloride: 103 mEq/L (ref 96–112)
Creatinine, Ser: 0.67 mg/dL (ref 0.40–1.20)
GFR: 91.17 mL/min (ref >60.00)
Glucose, Bld: 240 mg/dL — ABNORMAL HIGH (ref 70–99)
Potassium: 4.4 mEq/L (ref 3.5–5.1)
Sodium: 139 mEq/L (ref 135–145)
Total Bilirubin: 0.6 mg/dL (ref 0.2–1.2)
Total Protein: 6.7 g/dL (ref 6.0–8.3)

## 2021-09-23 LAB — LIPID PANEL
Cholesterol: 249 mg/dL — ABNORMAL HIGH (ref 0–200)
HDL: 47.9 mg/dL (ref >39.00)
LDL Cholesterol: 174 mg/dL — ABNORMAL HIGH (ref 0–99)
NonHDL: 201.3
Total CHOL/HDL Ratio: 5
Triglycerides: 135 mg/dL (ref 0.0–149.0)
VLDL: 27 mg/dL (ref 0.0–40.0)

## 2021-09-23 LAB — T4, FREE: Free T4: 0.81 ng/dL (ref 0.60–1.60)

## 2021-09-23 LAB — T3, FREE: T3, Free: 3.1 pg/mL (ref 2.3–4.2)

## 2021-09-23 LAB — VITAMIN B12: Vitamin B-12: 755 pg/mL (ref 211–911)

## 2021-09-23 LAB — CBC
HCT: 44.9 % (ref 36.0–46.0)
Hemoglobin: 15 g/dL (ref 12.0–15.0)
MCHC: 33.3 g/dL (ref 30.0–36.0)
MCV: 90.9 fl (ref 78.0–100.0)
Platelets: 240 10*3/uL (ref 150.0–400.0)
RBC: 4.94 Mil/uL (ref 3.87–5.11)
RDW: 13.2 % (ref 11.5–15.5)
WBC: 7 10*3/uL (ref 4.0–10.5)

## 2021-09-23 LAB — HEMOGLOBIN A1C: Hgb A1c MFr Bld: 10.8 % — ABNORMAL HIGH (ref 4.6–6.5)

## 2021-09-23 LAB — TSH: TSH: 1.45 u[IU]/mL (ref 0.35–5.50)

## 2021-09-23 LAB — VITAMIN D 25 HYDROXY (VIT D DEFICIENCY, FRACTURES): VITD: 7.62 ng/mL — ABNORMAL LOW (ref 30.00–100.00)

## 2021-09-23 NOTE — Assessment & Plan Note (Signed)
Check A1c.  Not on any medications.

## 2021-09-23 NOTE — Progress Notes (Signed)
   COLLIE KITTEL is a 66 y.o. female who presents today for an office visit.  Assessment/Plan:  New/Acute Problems: Other Fatigue Multifactorial.  We will check requested labs today including CBC, c-Met, TSH, T4, T3, B12, and vitamin D.  Chronic Problems Addressed Today: Controlled type 2 diabetes mellitus without complication, without long-term current use of insulin (HCC) Check A1c.  Not on any medications.  Thickening of skin of breast Continue management per general surgery.  Elevated blood pressure reading At goal today off medications.  Stress Stress levels seem to be improving.  We discussed medications or referral to see a therapist however she declined for now.     Subjective:  HPI:  See a/p for status of chronic conditions  She is concerned about low thyroid.  She has had low energy and fatigue as well as brain fog for several years.  She has been under a lot of stress the last couple of years with the passing of her husband but this seems to be doing better.  She was also recently had an abnormal mammogram was followed up with surgery for this.  She will be following up again with them in about 3 months.       Objective:  Physical Exam: BP 132/80   Pulse 78   Temp 98.6 F (37 C)   Resp (!) 96   Ht $R'5\' 4"'da$  (1.626 m)   Wt 219 lb 12.8 oz (99.7 kg)   BMI 37.73 kg/m   Gen: No acute distress, resting comfortably CV: Regular rate and rhythm with no murmurs appreciated Pulm: Normal work of breathing, clear to auscultation bilaterally with no crackles, wheezes, or rhonchi Neuro: Grossly normal, moves all extremities Psych: Normal affect and thought content      Caryle Helgeson M. Jerline Pain, MD 09/23/2021 12:44 PM

## 2021-09-23 NOTE — Assessment & Plan Note (Signed)
Continue management per general surgery.

## 2021-09-23 NOTE — Assessment & Plan Note (Signed)
At goal today off medications.

## 2021-09-23 NOTE — Patient Instructions (Signed)
It was very nice to see you today!  We will check blood work today.  Please let me know when you want to have colon cancer screening performed.  Please continue to work on diet and exercise.  I will see back in year.  Come back to see me sooner if needed.  Take care, Dr Jerline Pain  PLEASE NOTE:  If you had any lab tests please let us know if you have not heard back within a few days. You may see your results on mychart before we have a chance to review them but we will give you a call once they are reviewed by [place]. If we ordered any referrals today, please let us know if you have not heard from their office within the next week.   Please try these tips to maintain a healthy lifestyle:  Eat at least 3 REAL meals and 1-2 snacks per day.  Aim for no more than 5 hours between eating.  If you eat breakfast, please do so within one hour of getting up.   Each meal should contain half fruits/vegetables, one quarter protein, and one quarter carbs (no bigger than a computer mouse)  Cut down on sweet beverages. This includes juice, soda, and sweet tea.   Drink at least 1 glass of water with each meal and aim for at least 8 glasses per day  Exercise at least 150 minutes every week.

## 2021-09-23 NOTE — Assessment & Plan Note (Signed)
Stress levels seem to be improving.  We discussed medications or referral to see a therapist however she declined for now.

## 2021-09-26 NOTE — Progress Notes (Signed)
Please inform patient of the following:  Vitamin D is very low.  This is probably causing some of her symptoms.  Recommend starting 50,000 IUs once weekly.  Please send a new prescription.  We should recheck in 3 to 6 months.  Her A1c is very elevated and well into the diabetic range.  This may also explain some of her fatigue issues.  We need to start her on medications for this. Can we have her come in for an office visit to discuss?  Cholesterol numbers are also very elevated.  She may benefit from starting cholesterol medication we can also discuss this at her office visit.  Everything else including her thyroid numbers are normal.

## 2021-10-03 ENCOUNTER — Other Ambulatory Visit: Payer: Self-pay

## 2021-10-03 MED ORDER — VITAMIN D (ERGOCALCIFEROL) 1.25 MG (50000 UNIT) PO CAPS: 50000.0000 [IU] | ORAL_CAPSULE | ORAL | 0 refills | Status: DC

## 2021-10-06 ENCOUNTER — Ambulatory Visit (INDEPENDENT_AMBULATORY_CARE_PROVIDER_SITE_OTHER): Payer: Medicare Other | Admitting: Family Medicine

## 2021-10-06 ENCOUNTER — Encounter: Payer: Self-pay | Admitting: Family Medicine

## 2021-10-06 VITALS — BP 132/88 | HR 76 | Temp 97.5°F | Ht 64.0 in

## 2021-10-06 DIAGNOSIS — E119 Type 2 diabetes mellitus without complications: Secondary | ICD-10-CM | POA: Diagnosis not present

## 2021-10-06 DIAGNOSIS — E559 Vitamin D deficiency, unspecified: Secondary | ICD-10-CM

## 2021-10-06 DIAGNOSIS — E785 Hyperlipidemia, unspecified: Secondary | ICD-10-CM | POA: Insufficient documentation

## 2021-10-06 MED ORDER — TIRZEPATIDE 2.5 MG/0.5ML ~~LOC~~ SOAJ: 2.5000 mg | SUBCUTANEOUS | 0 refills | Status: DC

## 2021-10-06 MED ORDER — TIRZEPATIDE 5 MG/0.5ML ~~LOC~~ SOAJ: 5.0000 mg | SUBCUTANEOUS | 0 refills | Status: DC

## 2021-10-06 MED ORDER — VITAMIN D (ERGOCALCIFEROL) 1.25 MG (50000 UNIT) PO CAPS: 50000.0000 [IU] | ORAL_CAPSULE | ORAL | 0 refills | Status: DC

## 2021-10-06 NOTE — Assessment & Plan Note (Signed)
She accidentally took a couple days in a row of 50,000 IUs.  Patient is aware she should only be taking this weekly now.  We will send in refill today.  We can recheck again in 3 to 6 months.

## 2021-10-06 NOTE — Progress Notes (Signed)
   Tara [name] is a 66 y.o. female who presents today for an office visit.  Assessment/Plan:  Chronic Problems Addressed Today: Controlled type 2 diabetes mellitus without complication, without long-term current use of insulin (Tara [name]) We discussed treatment options.  A1c uncontrolled at 10.8.  We will start Mounjaro.  We discussed potential side effects.  She will follow-up with me in 3 months and we can recheck A1c at that time.  Dyslipidemia She would like to avoid starting statins for now.  She will work on diet and exercise.  Hopefully Tara [name] will help with weight loss as well.  We can recheck lipids in 3 to 6 months.  Vitamin D deficiency She accidentally took a couple days in a row of 50,000 IUs.  Patient is aware she should only be taking this weekly now.  We will send in refill today.  We can recheck again in 3 to 6 months.     Subjective:  HPI:  Patient here for follow-up.  We saw her 2 weeks ago for fatigue.  She was concerned about thyroid levels.  Labs showed hemoglobin A1c of 10.8.  Also showed elevated LDL 174.  Her vitamin D was also very low at 7.62.       Objective:  Physical Exam: BP 132/88   Pulse 76   Temp (!) 97.5 F (36.4 C) (Temporal)   Ht '5\' 4"'$  (1.626 m)   SpO2 95%   BMI 37.73 kg/m   Gen: No acute distress, resting comfortably Neuro: Grossly normal, moves all extremities Psych: Normal affect and thought content      Tara [name] M. Jerline Pain, MD 10/06/2021 11:37 AM

## 2021-10-06 NOTE — Assessment & Plan Note (Signed)
We discussed treatment options.  A1c uncontrolled at 10.8.  We will start Mounjaro.  We discussed potential side effects.  She will follow-up with me in 3 months and we can recheck A1c at that time.

## 2021-10-06 NOTE — Assessment & Plan Note (Signed)
She would like to avoid starting statins for now.  She will work on diet and exercise.  Hopefully [name] will help with weight loss as well.  We can recheck lipids in 3 to 6 months.

## 2021-10-06 NOTE — Patient Instructions (Signed)
It was very nice to see you today!  Please start the mounjaro.  Take 0.25 mg weekly for 4 weeks and then increase to 0.5 mg weekly.  I will send in more vitamin D.  Please come back in 3 months to recheck your blood sugar.  Come back sooner if needed.  Take care, Dr Jerline Pain  PLEASE NOTE:  If you had any lab tests please let us know if you have not heard back within a few days. You may see your results on mychart before we have a chance to review them but we will give you a call once they are reviewed by [place]. If we ordered any referrals today, please let us know if you have not heard from their office within the next week.   Please try these tips to maintain a healthy lifestyle:  Eat at least 3 REAL meals and 1-2 snacks per day.  Aim for no more than 5 hours between eating.  If you eat breakfast, please do so within one hour of getting up.   Each meal should contain half fruits/vegetables, one quarter protein, and one quarter carbs (no bigger than a computer mouse)  Cut down on sweet beverages. This includes juice, soda, and sweet tea.   Drink at least 1 glass of water with each meal and aim for at least 8 glasses per day  Exercise at least 150 minutes every week.

## 2021-10-07 ENCOUNTER — Telehealth: Payer: Self-pay | Admitting: Family Medicine

## 2021-10-07 NOTE — Telephone Encounter (Signed)
Pt states: -recently prescribed [name] will require a prior authorization -pharmacy explained they have sent over the PA.   Pt acknowledged that process could take 5-7 business days from when PCP team receives fax to get response to insurance for approval.

## 2021-10-07 NOTE — Telephone Encounter (Signed)
Key: [id number] - PA Case ID: [id number] - Rx #: [id number] Outcome Approvedtoday Your PA request has been approved. Additional information will be provided in the approval communication. (Message 1145) Drug Mounjaro 2.'5MG'$ /0.5ML pen-injectors

## 2021-10-07 NOTE — Telephone Encounter (Signed)
Pharmacy notified of PA approval, stated with approval priced is $450 Spoke with patient, patient will like to change medication due to price Please advise

## 2021-10-11 ENCOUNTER — Ambulatory Visit: Payer: Medicare Other | Admitting: Family Medicine

## 2021-10-11 NOTE — Telephone Encounter (Signed)
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 She isn't available between 12:45pm- 3:45pm on 10/4 - More info on how to get mounjaro cheaper

## 2021-10-11 NOTE — Telephone Encounter (Signed)
Spoke with patient give PCP recommendation  Patient ask if it was a pill or what. I informed metformin was a pill  Patient ask for price and side effect, advise to talk with  pharmacy for more information  Requested a nurse to call her

## 2021-10-11 NOTE — Telephone Encounter (Signed)
Previews message send to PCP

## 2021-10-11 NOTE — Telephone Encounter (Signed)
Recommend starting metformin '500mg'$  daily. Please send in new rx. We should see her back in 3 months to recheck.  Algis Greenhouse. Jerline Pain, MD 10/11/2021 12:58 PM

## 2021-10-12 NOTE — Telephone Encounter (Signed)
Called pt back to discuss her request. Informed her that we obtained the PA for [facility], but she said it's still expensive for her, I suggested that she look up GoodRx coupon and see if it will bring the price any cheaper, she stated that even if it will bring it  down to $100,  she still can't afford to pay monthly. She requested to ask Dr. Jerline Pain if there is any other medication that doesn't have Human Carcinogen elements in it. Routing to Dr. Jerline Pain for advise.

## 2021-10-13 ENCOUNTER — Ambulatory Visit (INDEPENDENT_AMBULATORY_CARE_PROVIDER_SITE_OTHER): Payer: Medicare Other

## 2021-10-13 VITALS — Wt 219.0 lb

## 2021-10-13 DIAGNOSIS — Z0000 Encounter for general adult medical examination without abnormal findings: Secondary | ICD-10-CM

## 2021-10-13 NOTE — Progress Notes (Signed)
Virtual Visit via Telephone Note  I connected with  Tara [name] on 10/13/21 at  1:45 PM EDT by telephone and verified that I am speaking with the correct person using two identifiers.  Medicare Annual Wellness visit completed telephonically due to Covid-19 pandemic.   Persons participating in this call: This Health Coach and this patient.   Location: Patient: home Provider: office    I discussed the limitations, risks, security and privacy concerns of performing an evaluation and management service by telephone and the availability of in person appointments. The patient expressed understanding and agreed to proceed.  Unable to perform video visit due to video visit attempted and failed and/or patient does not have video capability.   Some vital signs may be absent or patient reported.   Willette Brace, LPN   Subjective:   Tara [name] is a 66 y.o. female who presents for Medicare Annual (Subsequent) preventive examination.  Review of Systems     Cardiac Risk Factors include: advanced age (>31mn, >>83women);diabetes mellitus;dyslipidemia;obesity (BMI >30kg/m2)     Objective:    Today's Vitals   10/13/21 1334  Weight: 219 lb (99.3 kg)   Body mass index is 37.59 kg/m.     10/13/2021    1:49 PM  Advanced Directives  Does Patient Have a Medical Advance Directive? Yes  Type of [place]  Copy of [place]? No - copy requested    Current Medications (verified) Outpatient Encounter Medications as of 10/13/2021  Medication Sig   acetaminophen (TYLENOL) 325 MG tablet Take 650 mg by mouth as needed.   azelaic acid (AZELEX) 20 % cream Apply topically 2 (two) times daily. After skin is thoroughly washed and patted dry, gently but thoroughly massage a thin film of azelaic acid cream into the affected area twice daily, in the morning and evening.   b complex vitamins tablet Take 1 tablet by  mouth daily.   clindamycin (CLINDAGEL) 1 % gel clindamycin 1 % topical gel  APPLY THIN COAT TO AFFECTED AREA TWICE A DAY   famotidine-calcium carbonate-magnesium hydroxide (PEPCID COMPLETE) 10-800-165 MG chewable tablet Chew 1 tablet by mouth daily as needed.   Magnesium 200 MG TABS Take by mouth.   Omega-3 1000 MG CAPS Take by mouth.   TURMERIC PO Take by mouth.   Vitamin D, Ergocalciferol, (DRISDOL) 1.25 MG (50000 UNIT) CAPS capsule Take 1 capsule (50,000 Units total) by mouth every 7 (seven) days.   tirzepatide ([facility] 2.5 MG/0.5ML Pen Inject 2.5 mg into the skin once a week. (Patient not taking: Reported on 10/13/2021)   tirzepatide ([facility] 5 MG/0.5ML Pen Inject 5 mg into the skin once a week. (Patient not taking: Reported on 10/13/2021)   No facility-administered encounter medications on file as of 10/13/2021.    Allergies (verified) Cortisone and Oxycodone-acetaminophen   History: Past Medical History:  Diagnosis Date   Cervical spine tumor    Chest pain 11/07   Neg eval by cath   Costochondritis    Heart murmur    Birth Defect   PE (pulmonary embolism) 2007   Small   Reactive depression (situational)    [place]    Past Surgical History:  Procedure Laterality Date   ABDOMINAL HYSTERECTOMY  2007   CESAREAN SECTION  1982   REMOVAL OF BENIGN TUMOR - CERVICAL SPINE     Surgical Repair for Displaced Rod Fixation  2003   Family History  Problem Relation  Age of Onset   34 / Stillbirths Father    Hypertension Father    Hyperlipidemia Father    Coronary artery disease Father    Diabetes Father    Cancer Sister    Depression Sister    Breast cancer Daughter 87   Kidney failure Daughter    Colon cancer Paternal Aunt    Breast cancer Paternal Aunt        6s   Heart attack Brother    Depression Brother    Diabetes Brother    Hypertension Brother    Kidney disease Brother    Diabetes Brother        Severe complication including renal failure,  amputation   Drug abuse Brother    Heart disease Brother    Heart attack Brother    Depression Brother    Diabetes Brother    Hyperlipidemia Brother    Social History   Socioeconomic History   Marital status: Widowed    Spouse name: Not on file   Number of children: Not on file   Years of education: Not on file   Highest education level: Not on file  Occupational History   Occupation: Financial trader: [employer]  Tobacco Use   Smoking status: Never   Smokeless tobacco: Never  Substance and Sexual Activity   Alcohol use: No   Drug use: No   Sexual activity: Yes  Other Topics Concern   Not on file  [place]   Married 2671   2 daughters - '82, '85; 1 son - '91   Social Determinants of Health   Financial Resource Strain: Low Risk  (10/13/2021)   Overall Financial Resource Strain (CARDIA)    Difficulty of Paying Living Expenses: Not hard at all  Food Insecurity: No Food Insecurity (10/13/2021)   Hunger Vital Sign    Worried About Running Out of Food in the Last Year: Never true    [place] in the Last Year: Never true  Transportation Needs: No Transportation Needs (10/13/2021)   PRAPARE - Hydrologist (Medical): No    Lack of Transportation (Non-Medical): No  Physical Activity: Inactive (10/13/2021)   Exercise Vital Sign    Days of Exercise per Week: 0 days    Minutes of Exercise per Session: 0 min  Stress: Stress Concern Present (10/13/2021)   [place]    Feeling of Stress : To some extent  Social Connections: Moderately Integrated (10/13/2021)   Social Connection and Isolation Panel [NHANES]    Frequency of Communication with Friends and Family: More than three times a week    Frequency of Social Gatherings with Friends and Family: Three times a week    Attends Religious Services: More than 4 times per  year    Active Member of Clubs or Organizations: Yes    Attends Archivist Meetings: 1 to 4 times per year    Marital Status: Widowed    Tobacco Counseling Counseling given: Not Answered   Clinical Intake:  Pre-visit preparation completed: Yes  Pain : No/denies pain     BMI - recorded: 37.59 Nutritional Status: BMI > 30  Obese Nutritional Risks: None Diabetes: Yes CBG done?: No Did pt. bring in CBG monitor from home?: No  How often do you need to have someone help you when you read instructions, pamphlets,  or other written materials from your doctor or pharmacy?: 1 - Never  Diabetic?Nutrition Risk Assessment:  Has the patient had any N/V/D within the last 2 months?  No  Does the patient have any non-healing wounds?  No  Has the patient had any unintentional weight loss or weight gain?  No   Diabetes:  Is the patient diabetic?  Yes  If diabetic, was a CBG obtained today?  No  Did the patient bring in their glucometer from home?  No  How often do you monitor your CBG's? N/A.   Financial Strains and Diabetes Management:  Are you having any financial strains with the device, your supplies or your medication? No .  Does the patient want to be seen by Chronic Care Management for management of their diabetes?  No  Would the patient like to be referred to a Nutritionist or for Diabetic Management?  No   Diabetic Exams:  Diabetic Eye Exam: Overdue for diabetic eye exam. Pt has been advised about the importance in completing this exam. Patient advised to call and schedule an eye exam. Diabetic Foot Exam: Overdue, Pt has been advised about the importance in completing this exam. Pt is scheduled for diabetic foot exam on next appt.   Interpreter Needed?: No  Information entered by :: Charlott Rakes, LPN   Activities of Daily Living    10/13/2021    1:50 PM  In your present state of health, do you have any difficulty performing the following activities:   Hearing? 0  Vision? 0  Difficulty concentrating or making decisions? 0  Walking or climbing stairs? 0  Dressing or bathing? 0  Doing errands, shopping? 0  Preparing Food and eating ? N  Using the Toilet? N  In the past six months, have you accidently leaked urine? N  Do you have problems with loss of bowel control? N  Managing your Medications? N  Managing your Finances? N  Housekeeping or managing your Housekeeping? N    Patient Care Team: Vivi Barrack, MD as PCP - General (Family Medicine)  Indicate any recent Medical Services you may have received from other than [facility] providers in the past year (date may be approximate).     Assessment:   This is a routine wellness examination for [facility].  Hearing/Vision screen Hearing Screening - Comments:: Pt denies any hearing issues  Vision Screening - Comments:: Pt follows up with Dr Joya San for annual eye exams   Dietary issues and exercise activities discussed: Current Exercise Habits: The patient does not participate in regular exercise at present   Goals Addressed             This Visit's Progress    Patient Stated       Lose weight        Depression Screen    10/13/2021    1:48 PM 10/06/2021   11:01 AM 10/06/2021   11:00 AM 09/23/2021   12:11 PM 10/05/2020    3:48 PM 08/18/2019    1:21 PM  PHQ 2/9 Scores  PHQ - 2 Score 0 0 0 0 4 0  PHQ- 9 Score     16     Fall Risk    10/13/2021    1:50 PM 10/06/2021   11:00 AM 09/23/2021   12:11 PM 10/05/2020    3:48 PM 08/18/2019    1:21 PM  Fall Risk   Falls in the past year? 0 0 0 0 0  Number falls in past yr:  0 0 0 0   Injury with Fall? 0 0 0 0   Risk for fall due to : Impaired vision No Fall Risks   No Fall Risks  Follow up Falls prevention discussed        FALL RISK PREVENTION PERTAINING TO THE HOME:  Any stairs in or around the home? Yes  If so, are there any without handrails? No  Home free of loose throw rugs in walkways, pet beds, electrical cords, etc?  Yes  Adequate lighting in your home to reduce risk of falls? Yes   ASSISTIVE DEVICES UTILIZED TO PREVENT FALLS:  Life alert? No  Use of a cane, walker or w/c? No  Grab bars in the bathroom? Yes  Shower chair or bench in shower? No  Elevated toilet seat or a handicapped toilet? No   TIMED UP AND GO:  Was the test performed? No .   Cognitive Function:        10/13/2021    1:52 PM  6CIT Screen  What Year? 0 points  What month? 0 points  What time? 0 points  Count back from 20 0 points  Months in reverse 0 points  Repeat phrase 0 points  Total Score 0 points    Immunizations Immunization History  Administered Date(s) Administered   Janssen (J&J) SARS-COV-2 Vaccination 04/15/2019   Pfizer Covid-19 Vaccine Bivalent Booster 56yr & up 11/07/2019, 07/22/2020   Pfizer Covid-19 Vaccine Bivalent Booster 5y-11y 11/15/2020   Tdap 02/24/2014   Zoster Recombinat (Shingrix) 01/25/2021, 05/02/2021    TDAP status: Up to date  Flu Vaccine status: Declined, Education has been provided regarding the importance of this vaccine but patient still declined. Advised may receive this vaccine at local pharmacy or Health Dept. Aware to provide a copy of the vaccination record if obtained from local pharmacy or Health Dept. Verbalized acceptance and understanding.  Pneumococcal vaccine status: Declined,  Education has been provided regarding the importance of this vaccine but patient still declined. Advised may receive this vaccine at local pharmacy or Health Dept. Aware to provide a copy of the vaccination record if obtained from local pharmacy or Health Dept. Verbalized acceptance and understanding.   Covid-19 vaccine status: Completed vaccines  Qualifies for Shingles Vaccine? Yes   Zostavax completed Yes   Shingrix Completed?: Yes  Screening Tests Health Maintenance  Topic Date Due   FOOT EXAM  Never done   OPHTHALMOLOGY EXAM  Never done   Diabetic kidney evaluation - Urine ACR   10/05/2021   INFLUENZA VACCINE  04/09/2022 (Originally 08/09/2021)   DEXA SCAN  10/07/2022 (Originally 06/15/2020)   COLONOSCOPY (Pts 45-45yrInsurance coverage will need to be confirmed)  10/07/2022 (Originally 06/15/2000)   HEMOGLOBIN A1C  03/24/2022   MAMMOGRAM  04/27/2022   Diabetic kidney evaluation - GFR measurement  09/24/2022   TETANUS/TDAP  02/25/2024   COVID-19 Vaccine  Completed   Hepatitis C Screening  Completed   Zoster Vaccines- Shingrix  Completed   HPV VACCINES  Aged Out   Pneumonia Vaccine 6521Years old  Discontinued    Health Maintenance  Health Maintenance Due  Topic Date Due   FOOT EXAM  Never done   OPHTHALMOLOGY EXAM  Never done   Diabetic kidney evaluation - Urine ACR  10/05/2021    Postponed colonoscopy at this time   Mammogram status: Completed 04/26/21. Repeat every year     Additional Screening:  Hepatitis C Screening: Completed 08/30/15  Vision Screening: Recommended annual ophthalmology exams for early detection  of glaucoma and other disorders of the eye. Is the patient up to date with their annual eye exam?  Yes  Who is the provider or what is the name of the office in which the patient attends annual eye exams? Dr Joya San If pt is not established with a provider, would they like to be referred to a provider to establish care? No .   Dental Screening: Recommended annual dental exams for proper oral hygiene  Community Resource Referral / Chronic Care Management: CRR required this visit?  No   CCM required this visit?  No      Plan:     I have personally reviewed and noted the following in the patient's chart:   Medical and social history Use of alcohol, tobacco or illicit drugs  Current medications and supplements including opioid prescriptions. Patient is not currently taking opioid prescriptions. Functional ability and status Nutritional status Physical activity Advanced directives List of other physicians Hospitalizations,  surgeries, and ER visits in previous 12 months Vitals Screenings to include cognitive, depression, and falls Referrals and appointments  In addition, I have reviewed and discussed with patient certain preventive protocols, quality metrics, and best practice recommendations. A written personalized care plan for preventive services as well as general preventive health recommendations were provided to patient.     Willette Brace, LPN   [id number]   Nurse Notes: Pt stated she is unable to afford the mounjaro, she was unable to get any discounts, pt also stated she was against taking metformin due to side effects. Please advise

## 2021-10-13 NOTE — Telephone Encounter (Signed)
Did they insurance have any other suggested alternatives? Metformin is safe for her to use.  We can try ozempic but I am not sure if her insurance will pay for it.  Algis Greenhouse. Jerline Pain, MD 10/13/2021 7:23 AM

## 2021-10-13 NOTE — Patient Instructions (Signed)
[name] , Thank you for taking time to come for your Medicare Wellness Visit. I appreciate your ongoing commitment to your health goals. Please review the following plan we discussed and let me know if I can assist you in the future.   These are the goals we discussed:  Goals   None     This is a list of the screening recommended for you and due dates:  Health Maintenance  Topic Date Due   Complete foot exam   Never done   Eye exam for diabetics  Never done   Yearly kidney health urinalysis for diabetes  10/05/2021   Flu Shot  04/09/2022*   DEXA scan (bone density measurement)  10/07/2022*   Colon Cancer Screening  10/07/2022*   Hemoglobin A1C  03/24/2022   Mammogram  04/27/2022   Yearly kidney function blood test for diabetes  09/24/2022   Tetanus Vaccine  02/25/2024   COVID-19 Vaccine  Completed   Hepatitis C Screening: USPSTF Recommendation to screen - Ages 18-79 yo.  Completed   Zoster (Shingles) Vaccine  Completed   HPV Vaccine  Aged Out   Pneumonia Vaccine  Discontinued  *Topic was postponed. The date shown is not the original due date.    Advanced directives: Please bring a copy of your health care power of attorney and living will to the office at your convenience.  Conditions/risks identified: lose weight and get blood sugar down   Next appointment: Follow up in one year for your annual wellness visit    Preventive Care 65 Years and Older, Female Preventive care refers to lifestyle choices and visits with your health care provider that can promote health and wellness. What does preventive care include? A yearly physical exam. This is also called an annual well check. Dental exams once or twice a year. Routine eye exams. Ask your health care provider how often you should have your eyes checked. Personal lifestyle choices, including: Daily care of your teeth and gums. Regular physical activity. Eating a healthy diet. Avoiding tobacco and drug use. Limiting  alcohol use. Practicing safe sex. Taking low-dose aspirin every day. Taking vitamin and mineral supplements as recommended by your health care provider. What happens during an annual well check? The services and screenings done by your health care provider during your annual well check will depend on your age, overall health, lifestyle risk factors, and family history of disease. Counseling  Your health care provider may ask you questions about your: Alcohol use. Tobacco use. Drug use. Emotional well-being. Home and relationship well-being. Sexual activity. Eating habits. History of falls. Memory and ability to understand (cognition). Work and work Statistician. Reproductive health. Screening  You may have the following tests or measurements: Height, weight, and BMI. Blood pressure. Lipid and cholesterol levels. These may be checked every 5 years, or more frequently if you are over 76 years old. Skin check. Lung cancer screening. You may have this screening every year starting at age 75 if you have a 30-pack-year history of smoking and currently smoke or have quit within the past 15 years. Fecal occult blood test (FOBT) of the stool. You may have this test every year starting at age 58. Flexible sigmoidoscopy or colonoscopy. You may have a sigmoidoscopy every 5 years or a colonoscopy every 10 years starting at age 18. Hepatitis C blood test. Hepatitis B blood test. Sexually transmitted disease (STD) testing. Diabetes screening. This is done by checking your blood sugar (glucose) after you have not eaten for  a while (fasting). You may have this done every 1-3 years. Bone density scan. This is done to screen for osteoporosis. You may have this done starting at age 55. Mammogram. This may be done every 1-2 years. Talk to your health care provider about how often you should have regular mammograms. Talk with your health care provider about your test results, treatment options, and if  necessary, the need for more tests. Vaccines  Your health care provider may recommend certain vaccines, such as: Influenza vaccine. This is recommended every year. Tetanus, diphtheria, and acellular pertussis (Tdap, Td) vaccine. You may need a Td booster every 10 years. Zoster vaccine. You may need this after age 9. Pneumococcal 13-valent conjugate (PCV13) vaccine. One dose is recommended after age 29. Pneumococcal polysaccharide (PPSV23) vaccine. One dose is recommended after age 67. Talk to your health care provider about which screenings and vaccines you need and how often you need them. This information is not intended to replace advice given to you by your health care provider. Make sure you discuss any questions you have with your health care provider. Document Released: 01/22/2015 Document Revised: 09/15/2015 Document Reviewed: 10/27/2014 Elsevier Interactive Patient Education  2017 [place] Prevention in the Home Falls can cause injuries. They can happen to people of all ages. There are many things you can do to make your home safe and to help prevent falls. What can I do on the outside of my home? Regularly fix the edges of walkways and driveways and fix any cracks. Remove anything that might make you trip as you walk through a door, such as a raised step or threshold. Trim any bushes or trees on the path to your home. Use bright outdoor lighting. Clear any walking paths of anything that might make someone trip, such as rocks or tools. Regularly check to see if handrails are loose or broken. Make sure that both sides of any steps have handrails. Any raised decks and porches should have guardrails on the edges. Have any leaves, snow, or ice cleared regularly. Use sand or salt on walking paths during winter. Clean up any spills in your garage right away. This includes oil or grease spills. What can I do in the bathroom? Use night lights. Install grab bars by the toilet  and in the tub and shower. Do not use towel bars as grab bars. Use non-skid mats or decals in the tub or shower. If you need to sit down in the shower, use a plastic, non-slip stool. Keep the floor dry. Clean up any water that spills on the floor as soon as it happens. Remove soap buildup in the tub or shower regularly. Attach bath mats securely with double-sided non-slip rug tape. Do not have throw rugs and other things on the floor that can make you trip. What can I do in the bedroom? Use night lights. Make sure that you have a light by your bed that is easy to reach. Do not use any sheets or blankets that are too big for your bed. They should not hang down onto the floor. Have a firm chair that has side arms. You can use this for support while you get dressed. Do not have throw rugs and other things on the floor that can make you trip. What can I do in the kitchen? Clean up any spills right away. Avoid walking on wet floors. Keep items that you use a lot in easy-to-reach places. If you need to reach something above  you, use a strong step stool that has a grab bar. Keep electrical cords out of the way. Do not use floor polish or wax that makes floors slippery. If you must use wax, use non-skid floor wax. Do not have throw rugs and other things on the floor that can make you trip. What can I do with my stairs? Do not leave any items on the stairs. Make sure that there are handrails on both sides of the stairs and use them. Fix handrails that are broken or loose. Make sure that handrails are as long as the stairways. Check any carpeting to make sure that it is firmly attached to the stairs. Fix any carpet that is loose or worn. Avoid having throw rugs at the top or bottom of the stairs. If you do have throw rugs, attach them to the floor with carpet tape. Make sure that you have a light switch at the top of the stairs and the bottom of the stairs. If you do not have them, ask someone to add  them for you. What else can I do to help prevent falls? Wear shoes that: Do not have high heels. Have rubber bottoms. Are comfortable and fit you well. Are closed at the toe. Do not wear sandals. If you use a stepladder: Make sure that it is fully opened. Do not climb a closed stepladder. Make sure that both sides of the stepladder are locked into place. Ask someone to hold it for you, if possible. Clearly mark and make sure that you can see: Any grab bars or handrails. First and last steps. Where the edge of each step is. Use tools that help you move around (mobility aids) if they are needed. These include: Canes. Walkers. Scooters. Crutches. Turn on the lights when you go into a dark area. Replace any light bulbs as soon as they burn out. Set up your furniture so you have a clear path. Avoid moving your furniture around. If any of your floors are uneven, fix them. If there are any pets around you, be aware of where they are. Review your medicines with your doctor. Some medicines can make you feel dizzy. This can increase your chance of falling. Ask your doctor what other things that you can do to help prevent falls. This information is not intended to replace advice given to you by your health care provider. Make sure you discuss any questions you have with your health care provider. Document Released: 10/22/2008 Document Revised: 06/03/2015 Document Reviewed: 01/30/2014 Elsevier Interactive Patient Education  2017 [employer].

## 2021-10-14 ENCOUNTER — Other Ambulatory Visit: Payer: Self-pay | Admitting: *Deleted

## 2021-10-14 ENCOUNTER — Telehealth: Payer: Self-pay | Admitting: *Deleted

## 2021-10-14 MED ORDER — TIRZEPATIDE 2.5 MG/0.5ML ~~LOC~~ SOAJ: 2.5000 mg | SUBCUTANEOUS | 0 refills | Status: DC

## 2021-10-14 MED ORDER — OZEMPIC (0.25 OR 0.5 MG/DOSE) 2 MG/3ML ~~LOC~~ SOPN: 0.2500 mg | PEN_INJECTOR | SUBCUTANEOUS | 1 refills | Status: DC

## 2021-10-14 MED ORDER — TIRZEPATIDE 5 MG/0.5ML ~~LOC~~ SOAJ: 5.0000 mg | SUBCUTANEOUS | 0 refills | Status: DC

## 2021-10-14 NOTE — Telephone Encounter (Signed)
Patient called, stated spoke with insurance. Tara [name] will be on a lower priced if order throw a mail order FEP [phone] Patent requesting to go back to mounjaro and discontinue ozemic  Pharmacy notified to cancelled Rx [employer] [facility]

## 2021-10-14 NOTE — Addendum Note (Signed)
Addended by: Betti Cruz on: 10/14/2021 11:56 AM   Modules accepted: Orders

## 2021-10-14 NOTE — Telephone Encounter (Signed)
See note

## 2021-10-14 NOTE — Telephone Encounter (Signed)
Patient called Spoke with patient, give recommendation Per Dr Ellwood Handler note  Patient agreed with Cyril Mourning to send Rx  Ozempic to pharmacy  Rx send

## 2021-10-19 ENCOUNTER — Telehealth: Payer: Self-pay | Admitting: Family Medicine

## 2021-10-19 NOTE — Telephone Encounter (Signed)
Caller states: -Pharmacy received a med refill for mounjaro 2.5 mg and mounjaro 5 mg  Caller requests: - A callback to confirm which dosage patient is currently using or should be using so it may be filled correctly    Caller can be reached at [phone], Option 2 Reference number: [id number]

## 2021-10-19 NOTE — Telephone Encounter (Signed)
Called patient instead of insurance  No information needed for patient

## 2021-10-19 NOTE — Telephone Encounter (Signed)
Patient states: -She received a call from PCP office but is unsure why - She will be leaving home soon and is unable to talk and the phone while driving  Patient requests: - A callback to explain reasoning for call. If no answer, please LVM.

## 2021-10-19 NOTE — Telephone Encounter (Signed)
Spoke with [employer] at [phone] Patient will start with [facility] 2.9fr 4 weeks then '5mg'$ 

## 2021-10-20 NOTE — Telephone Encounter (Signed)
Spoke with patient, patient aware was a miss call

## 2021-10-25 ENCOUNTER — Ambulatory Visit: Payer: Medicare Other

## 2021-11-01 ENCOUNTER — Encounter: Payer: Self-pay | Admitting: Family Medicine

## 2021-11-01 ENCOUNTER — Telehealth: Payer: Self-pay | Admitting: Family Medicine

## 2021-11-01 NOTE — Telephone Encounter (Signed)
Do they just need [place] to send in more? Not sure what they are asking.  Algis Greenhouse. Jerline Pain, MD 11/01/2021 3:19 PM

## 2021-11-01 NOTE — Telephone Encounter (Signed)
Pt states: -there are no more refills on Mounjaro 5.0 MG -10/24 second dose taken, has two injections left. -She spoke with Hilda Blades at [facility], rep told pt PCP team needs to contact "them" to prepare for upcoming refill on 11/01.  -this is not for a prior authorization   Pt request: -PCP team reach out to [place] [facility] at number below to prepare for [facility]. [phone]

## 2021-11-01 NOTE — Telephone Encounter (Signed)
Please advise 

## 2021-11-02 NOTE — Telephone Encounter (Signed)
Refill was send on 10/14/2021

## 2021-11-10 ENCOUNTER — Other Ambulatory Visit: Payer: Self-pay | Admitting: Family Medicine

## 2021-11-16 ENCOUNTER — Other Ambulatory Visit: Payer: Self-pay | Admitting: *Deleted

## 2021-12-03 ENCOUNTER — Other Ambulatory Visit: Payer: Self-pay | Admitting: Family Medicine

## 2021-12-12 LAB — HM DIABETES EYE EXAM

## 2021-12-22 ENCOUNTER — Other Ambulatory Visit: Payer: Self-pay | Admitting: Family Medicine

## 2021-12-26 ENCOUNTER — Ambulatory Visit: Payer: Medicare Other | Admitting: Family Medicine

## 2021-12-26 ENCOUNTER — Other Ambulatory Visit: Payer: Self-pay | Admitting: Family Medicine

## 2021-12-27 ENCOUNTER — Ambulatory Visit (INDEPENDENT_AMBULATORY_CARE_PROVIDER_SITE_OTHER): Payer: Medicare Other | Admitting: Family Medicine

## 2021-12-27 ENCOUNTER — Encounter: Payer: Self-pay | Admitting: Family Medicine

## 2021-12-27 VITALS — BP 127/76 | HR 69 | Temp 97.7°F | Ht 64.0 in | Wt 208.8 lb

## 2021-12-27 DIAGNOSIS — E785 Hyperlipidemia, unspecified: Secondary | ICD-10-CM

## 2021-12-27 DIAGNOSIS — E119 Type 2 diabetes mellitus without complications: Secondary | ICD-10-CM | POA: Diagnosis not present

## 2021-12-27 DIAGNOSIS — E669 Obesity, unspecified: Secondary | ICD-10-CM | POA: Diagnosis not present

## 2021-12-27 LAB — POCT GLYCOSYLATED HEMOGLOBIN (HGB A1C): Hemoglobin A1C: 6.4 % — AB (ref 4.0–5.6)

## 2021-12-27 MED ORDER — TIRZEPATIDE 7.5 MG/0.5ML ~~LOC~~ SOAJ: 7.5000 mg | SUBCUTANEOUS | 3 refills | Status: DC

## 2021-12-27 NOTE — Assessment & Plan Note (Signed)
She is down 11 pounds since her last visit.  BMI today is 35 with comorbidities.  Will be increasing the dose of Mounjaro as above which should help with weight loss.  We also discussed lifestyle modifications.

## 2021-12-27 NOTE — Progress Notes (Signed)
   Tara [name] is a 66 y.o. female who presents today for an office visit.  Assessment/Plan:  Chronic Problems Addressed Today: Controlled type 2 diabetes mellitus without complication, without long-term current use of insulin (HCC) A1c much better controlled 6.4.  She is interested in going up on the dose of Mounjaro to help with weight loss.  Will increase to 7.5 mg weekly.  Follow-up in 3 months to recheck A1c.  She is aware of potential side effects with Mounjaro.  Obesity She is down 11 pounds since her last visit.  BMI today is 35 with comorbidities.  Will be increasing the dose of Mounjaro as above which should help with weight loss.  We also discussed lifestyle modifications.     Subjective:  HPI:  See A/p for status of chronic conditions.     She is here for diabetes follow-up.  We saw her 3 months ago.  A1c elevated at 10.8 at last visit.  We started her on Mounjaro 2.5 mg weekly.  She did this for 4 weeks and then increase to 5 mg weekly.  She has done well with this regimen.  No significant side effects.       Objective:  Physical Exam: BP 127/76   Pulse 69   Temp 97.7 F (36.5 C) (Temporal)   Ht '5\' 4"'$  (1.626 m)   Wt 208 lb 12.8 oz (94.7 kg)   SpO2 95%   BMI 35.84 kg/m   Wt Readings from Last 3 Encounters:  12/27/21 208 lb 12.8 oz (94.7 kg)  10/13/21 219 lb (99.3 kg)  09/23/21 219 lb 12.8 oz (99.7 kg)    Gen: No acute distress, resting comfortably CV: Regular rate and rhythm with no murmurs appreciated Pulm: Normal work of breathing, clear to auscultation bilaterally with no crackles, wheezes, or rhonchi Neuro: Grossly normal, moves all extremities Psych: Normal affect and thought content      Tara [name] M. Jerline Pain, MD 12/27/2021 8:01 AM

## 2021-12-27 NOTE — Patient Instructions (Signed)
It was very nice to see you today!  Your A1c looks great today.  Keep up the great work with your diet and exercise.  We will increase your Mounjaro to 7.5 mg weekly.  Please come back to see me in 3 months.  Come back sooner if needed.  Take care, Dr Jerline Pain  PLEASE NOTE:  If you had any lab tests, please let us know if you have not heard back within a few days. You may see your results on mychart before we have a chance to review them but we will give you a call once they are reviewed by [place].   If we ordered any referrals today, please let us know if you have not heard from their office within the next week.   If you had any urgent prescriptions sent in today, please check with the pharmacy within an hour of our visit to make sure the prescription was transmitted appropriately.   Please try these tips to maintain a healthy lifestyle:  Eat at least 3 REAL meals and 1-2 snacks per day.  Aim for no more than 5 hours between eating.  If you eat breakfast, please do so within one hour of getting up.   Each meal should contain half fruits/vegetables, one quarter protein, and one quarter carbs (no bigger than a computer mouse)  Cut down on sweet beverages. This includes juice, soda, and sweet tea.   Drink at least 1 glass of water with each meal and aim for at least 8 glasses per day  Exercise at least 150 minutes every week.

## 2021-12-27 NOTE — Assessment & Plan Note (Signed)
A1c much better controlled 6.4.  She is interested in going up on the dose of Mounjaro to help with weight loss.  Will increase to 7.5 mg weekly.  Follow-up in 3 months to recheck A1c.  She is aware of potential side effects with Mounjaro.

## 2021-12-29 ENCOUNTER — Telehealth: Payer: Self-pay | Admitting: Family Medicine

## 2021-12-29 ENCOUNTER — Other Ambulatory Visit: Payer: Self-pay | Admitting: *Deleted

## 2021-12-29 NOTE — Telephone Encounter (Signed)
Spoke with patient, advise to continue taking Vid D as prescribed  Next OV we will repeat Vid D labs. Patient verbalized understanding Request last lab message to be send via [employer]

## 2021-12-29 NOTE — Telephone Encounter (Signed)
Pt was here on the 19th and would like to know if she needs labs done for her vitamin D meds. Please advise and call pt back with details.

## 2022-01-05 ENCOUNTER — Ambulatory Visit: Payer: Medicare Other | Admitting: Family Medicine

## 2022-02-23 ENCOUNTER — Telehealth: Payer: Self-pay | Admitting: Family Medicine

## 2022-02-23 ENCOUNTER — Other Ambulatory Visit: Payer: Self-pay | Admitting: *Deleted

## 2022-02-23 DIAGNOSIS — E559 Vitamin D deficiency, unspecified: Secondary | ICD-10-CM

## 2022-02-23 NOTE — Telephone Encounter (Signed)
  Encourage patient to contact the pharmacy for refills or they can request refills through [place]:  Please schedule appointment if longer than 1 year  NEXT APPOINTMENT DATE:  MEDICATION:  Vitamin D, Ergocalciferol, (DRISDOL) 1.25 MG (50000 UNIT) CAPS capsule   Is the patient out of medication? YES  [facility] [address] [place] [place] Phone: [phone]  Fax: [phone]      Let patient know to contact pharmacy at the end of the day to make sure medication is ready.  Please notify patient to allow 48-72 hours to process

## 2022-02-23 NOTE — Telephone Encounter (Signed)
Patient notified need repeat Vit D, Order placed   Continue OTC 1000-2000unit daily  Stated has OV next month. Will repeat lab on visit

## 2022-03-01 ENCOUNTER — Encounter: Payer: Self-pay | Admitting: Family Medicine

## 2022-03-13 ENCOUNTER — Other Ambulatory Visit: Payer: Self-pay | Admitting: General Surgery

## 2022-03-13 DIAGNOSIS — N6459 Other signs and symptoms in breast: Secondary | ICD-10-CM

## 2022-03-27 ENCOUNTER — Ambulatory Visit: Payer: Medicare Other | Admitting: Family Medicine

## 2022-03-28 ENCOUNTER — Encounter: Payer: Self-pay | Admitting: Family Medicine

## 2022-03-28 ENCOUNTER — Ambulatory Visit (INDEPENDENT_AMBULATORY_CARE_PROVIDER_SITE_OTHER): Payer: Medicare Other | Admitting: Family Medicine

## 2022-03-28 VITALS — BP 124/77 | HR 82 | Temp 97.8°F | Ht 64.0 in | Wt 208.4 lb

## 2022-03-28 DIAGNOSIS — E119 Type 2 diabetes mellitus without complications: Secondary | ICD-10-CM | POA: Diagnosis not present

## 2022-03-28 DIAGNOSIS — E559 Vitamin D deficiency, unspecified: Secondary | ICD-10-CM | POA: Diagnosis not present

## 2022-03-28 DIAGNOSIS — Z1211 Encounter for screening for malignant neoplasm of colon: Secondary | ICD-10-CM | POA: Diagnosis not present

## 2022-03-28 DIAGNOSIS — E669 Obesity, unspecified: Secondary | ICD-10-CM | POA: Diagnosis not present

## 2022-03-28 LAB — POCT GLYCOSYLATED HEMOGLOBIN (HGB A1C): Hemoglobin A1C: 5.7 % — AB (ref 4.0–5.6)

## 2022-03-28 LAB — VITAMIN D 25 HYDROXY (VIT D DEFICIENCY, FRACTURES): VITD: 25.15 ng/mL — ABNORMAL LOW (ref 30.00–100.00)

## 2022-03-28 MED ORDER — TIRZEPATIDE 12.5 MG/0.5ML ~~LOC~~ SOAJ: 12.5000 mg | SUBCUTANEOUS | 0 refills | Status: DC

## 2022-03-28 MED ORDER — TIRZEPATIDE 15 MG/0.5ML ~~LOC~~ SOAJ: 15.0000 mg | SUBCUTANEOUS | 5 refills | Status: DC

## 2022-03-28 MED ORDER — TIRZEPATIDE 10 MG/0.5ML ~~LOC~~ SOAJ: 10.0000 mg | SUBCUTANEOUS | 0 refills | Status: AC

## 2022-03-28 NOTE — Progress Notes (Signed)
   Tara [name] is a 67 y.o. female who presents today for an office visit.  Assessment/Plan:  Chronic Problems Addressed Today: Controlled type 2 diabetes mellitus without complication, without long-term current use of insulin (HCC) A1c very well-controlled at 5.7.  She is interested in increasing the dose of Mounjaro to also help with weight loss.  We will go to 10 mg weekly for 4 weeks, then increase 12.5mg  weekly for 4 weeks, then increase 15mg  weekly. We discussed potential side effects. She does have some mild constipation that is manageable.  She can follow-up with me in a few weeks via MyChart to let me know how she is doing with the increased dose.  Will recheck A1c in 3-6 months.   Obesity Weight stable since last time.  We are increasing dose of Mounjaro as above.  Follow-up in 3 to 6 months.  Vitamin D deficiency Now on vitamin D 2000 IUs daily.  Will check vitamin D level today.     Subjective:  HPI:  See A/p for status of chronic conditions. She is here today for follow up.  Last saw her 3 months ago.  She was doing well at that time.  She has been compliant with medications.  She has been tolerating Mounjaro 7.5 mg weekly well.  Does have some mild constipation which she has manageable with dietary modifications.       Objective:  Physical Exam: BP 124/77   Pulse 82   Temp 97.8 F (36.6 C) (Temporal)   Ht 5\' 4"  (1.626 m)   Wt 208 lb 6.4 oz (94.5 kg)   SpO2 97%   BMI 35.77 kg/m   Wt Readings from Last 3 Encounters:  03/28/22 208 lb 6.4 oz (94.5 kg)  12/27/21 208 lb 12.8 oz (94.7 kg)  10/13/21 219 lb (99.3 kg)    Gen: No acute distress, resting comfortably CV: Regular rate and rhythm with no murmurs appreciated Pulm: Normal work of breathing, clear to auscultation bilaterally with no crackles, wheezes, or rhonchi Neuro: Grossly normal, moves all extremities Psych: Normal affect and thought content      Tara [name] M. Jerline Pain, MD 03/28/2022 8:11 AM

## 2022-03-28 NOTE — Assessment & Plan Note (Signed)
Weight stable since last time.  We are increasing dose of Mounjaro as above.  Follow-up in 3 to 6 months.

## 2022-03-28 NOTE — Assessment & Plan Note (Signed)
Now on vitamin D 2000 IUs daily.  Will check vitamin D level today.

## 2022-03-28 NOTE — Assessment & Plan Note (Signed)
A1c very well-controlled at 5.7.  She is interested in increasing the dose of Mounjaro to also help with weight loss.  We will go to 10 mg weekly for 4 weeks, then increase 12.5mg  weekly for 4 weeks, then increase 15mg  weekly. We discussed potential side effects. She does have some mild constipation that is manageable.  She can follow-up with me in a few weeks via MyChart to let me know how she is doing with the increased dose.  Will recheck A1c in 3-6 months.

## 2022-03-28 NOTE — Patient Instructions (Signed)
It was very nice to see you today!  Your A1c today is 5.7.  We will increase your dose of Mounjaro over the next few months.  Please send me a message in a few weeks to let me know how you are doing.  We will refer you for colonoscopy.  Will check your vitamin D level today.  I will see you back in 3 to 6 months.  Please come back to see [place] sooner if needed.  Take care, Dr Jerline Pain  PLEASE NOTE:  If you had any lab tests, please let us know if you have not heard back within a few days. You may see your results on mychart before we have a chance to review them but we will give you a call once they are reviewed by [place].   If we ordered any referrals today, please let us know if you have not heard from their office within the next week.   If you had any urgent prescriptions sent in today, please check with the pharmacy within an hour of our visit to make sure the prescription was transmitted appropriately.   Please try these tips to maintain a healthy lifestyle:  Eat at least 3 REAL meals and 1-2 snacks per day.  Aim for no more than 5 hours between eating.  If you eat breakfast, please do so within one hour of getting up.   Each meal should contain half fruits/vegetables, one quarter protein, and one quarter carbs (no bigger than a computer mouse)  Cut down on sweet beverages. This includes juice, soda, and sweet tea.   Drink at least 1 glass of water with each meal and aim for at least 8 glasses per day  Exercise at least 150 minutes every week.

## 2022-03-29 ENCOUNTER — Encounter: Payer: Self-pay | Admitting: Gastroenterology

## 2022-03-30 ENCOUNTER — Other Ambulatory Visit: Payer: Self-pay

## 2022-03-30 DIAGNOSIS — E559 Vitamin D deficiency, unspecified: Secondary | ICD-10-CM

## 2022-03-30 NOTE — Progress Notes (Signed)
Please inform patient of the following:  Vitamin D still a bit low but much better than last time.  She can take 1000 to 2000 IUs once daily for maintenance and we can recheck again in 3 to 6 months.

## 2022-04-25 ENCOUNTER — Ambulatory Visit
Admission: RE | Admit: 2022-04-25 | Discharge: 2022-04-25 | Disposition: A | Discharge status: Home / Self Care | Payer: Medicare Other | Source: Ambulatory Visit | Attending: General Surgery | Admitting: General Surgery

## 2022-04-25 DIAGNOSIS — N6459 Other signs and symptoms in breast: Secondary | ICD-10-CM

## 2022-05-04 ENCOUNTER — Ambulatory Visit (AMBULATORY_SURGERY_CENTER): Payer: Medicare Other

## 2022-05-04 VITALS — Ht 64.0 in | Wt 200.0 lb

## 2022-05-04 DIAGNOSIS — Z1211 Encounter for screening for malignant neoplasm of colon: Secondary | ICD-10-CM

## 2022-05-04 MED ORDER — PEG 3350-KCL-NA BICARB-NACL 420 G PO SOLR: 4000.0000 mL | Freq: Once | ORAL | 0 refills | Status: AC

## 2022-05-04 NOTE — Progress Notes (Signed)
Pre visit completed via phone call; Patient verified name, DOB, and address;  No egg or soy allergy known to patient;  No issues known to pt with past sedation with any surgeries or procedures; Patient denies ever being told they had issues or difficulty with intubation;  No FH of Malignant Hyperthermia; Pt is not on diet pills; Pt is not on home 02;  Pt is not on blood thinners;  Pt reports issues with constipation with relation to taking Mounjaro- taking a gentle laxative daily to prevent constipation- advised patient to continue taking medication prior to prep;  No A fib or A flutter; Have any cardiac testing pending--NO Pt instructed to use Single[employer] or GoodRx for a price reduction on prep;   Insurance verified during PV appt=Medicare A/B  Patient's chart reviewed by Cathlyn Parsons CNRA prior to previsit and patient appropriate for the [facility].  Previsit completed and red dot placed by patient's name on their procedure day (on provider's schedule).    Printed instructions and mailed to patient per her request;

## 2022-05-15 ENCOUNTER — Telehealth: Payer: Self-pay | Admitting: Gastroenterology

## 2022-05-15 NOTE — Telephone Encounter (Signed)
During PV it was discussed about when she should stop taking Mounjaro. She recalls today being the last injection she should have. Please advise

## 2022-05-15 NOTE — Telephone Encounter (Signed)
Pt call returned all questions answered   

## 2022-05-23 ENCOUNTER — Ambulatory Visit (AMBULATORY_SURGERY_CENTER): Payer: Medicare Other | Admitting: Gastroenterology

## 2022-05-23 ENCOUNTER — Encounter: Payer: Self-pay | Admitting: Gastroenterology

## 2022-05-23 VITALS — BP 123/65 | HR 69 | Temp 98.0°F | Resp 14 | Ht 64.0 in | Wt 200.0 lb

## 2022-05-23 DIAGNOSIS — D125 Benign neoplasm of sigmoid colon: Secondary | ICD-10-CM

## 2022-05-23 DIAGNOSIS — Z1211 Encounter for screening for malignant neoplasm of colon: Secondary | ICD-10-CM | POA: Diagnosis not present

## 2022-05-23 MED ORDER — SODIUM CHLORIDE 0.9 % IV SOLN
500.0000 mL | INTRAVENOUS | Status: AC
Start: 2022-05-23 — End: ?

## 2022-05-23 NOTE — Progress Notes (Signed)
Called to room to assist during endoscopic procedure.  Patient ID and intended procedure confirmed with present staff. Received instructions for my participation in the procedure from the performing physician.  

## 2022-05-23 NOTE — Progress Notes (Signed)
A and O x3. Report to RN. Tolerated MAC anesthesia well. 

## 2022-05-23 NOTE — Progress Notes (Signed)
Pt. states no medical or surgical changes since previsit or office visit. 

## 2022-05-23 NOTE — Patient Instructions (Addendum)
Resume previous diet Continue present medications Await pathology results  Handouts/information given for polyps, diverticulosis and hemorrhoids  YOU HAD AN ENDOSCOPIC PROCEDURE TODAY AT THE [facility]:   Refer to the procedure report that was given to you for any specific questions about what was found during the examination.  If the procedure report does not answer your questions, please call your gastroenterologist to clarify.  If you requested that your care partner not be given the details of your procedure findings, then the procedure report has been included in a sealed envelope for you to review at your convenience later.  YOU SHOULD EXPECT: Some feelings of bloating in the abdomen. Passage of more gas than usual.  Walking can help get rid of the air that was put into your GI tract during the procedure and reduce the bloating. If you had a lower endoscopy (such as a colonoscopy or flexible sigmoidoscopy) you may notice spotting of blood in your stool or on the toilet paper. If you underwent a bowel prep for your procedure, you may not have a normal bowel movement for a few days.  Please Note:  You might notice some irritation and congestion in your nose or some drainage.  This is from the oxygen used during your procedure.  There is no need for concern and it should clear up in a day or so.  SYMPTOMS TO REPORT IMMEDIATELY:  Following lower endoscopy (colonoscopy):  Excessive amounts of blood in the stool  Significant tenderness or worsening of abdominal pains  Swelling of the abdomen that is new, acute  Fever of 100F or higher  For urgent or emergent issues, a gastroenterologist can be reached at any hour by calling [phone]. Do not use MyChart messaging for urgent concerns.   DIET:  We do recommend a small meal at first, but then you may proceed to your regular diet.  Drink plenty of fluids but you should avoid alcoholic beverages for 24 hours.  ACTIVITY:  You  should plan to take it easy for the rest of today and you should NOT DRIVE or use heavy machinery until tomorrow (because of the sedation medicines used during the test).    FOLLOW UP: Our staff will call the number listed on your records the next business day following your procedure.  We will call around 7:15- 8:00 am to check on you and address any questions or concerns that you may have regarding the information given to you following your procedure. If we do not reach you, we will leave a message.     If any biopsies were taken you will be contacted by phone or by letter within the next 1-3 weeks.  Please call us at [phone] if you have not heard about the biopsies in 3 weeks.    SIGNATURES/CONFIDENTIALITY: You and/or your care partner have signed paperwork which will be entered into your electronic medical record.  These signatures attest to the fact that that the information above on your After Visit Summary has been reviewed and is understood.  Full responsibility of the confidentiality of this discharge information lies with you and/or your care-partner. 

## 2022-05-23 NOTE — Op Note (Signed)
[facility] Patient Name: Tara [name] Procedure Date: 05/23/2022 11:31 AM MRN: [id number] Endoscopist: Napoleon Form , MD, [id number] Age: 67 Referring MD:  Date of Birth: [birth year] Gender: Female Account #: [id number] Procedure:                Colonoscopy Indications:              Screening for colorectal malignant neoplasm Medicines:                Monitored Anesthesia Care Procedure:                Pre-Anesthesia Assessment:                           - Prior to the procedure, a History and Physical                            was performed, and patient medications and                            allergies were reviewed. The patient's tolerance of                            previous anesthesia was also reviewed. The risks                            and benefits of the procedure and the sedation                            options and risks were discussed with the patient.                            All questions were answered, and informed consent                            was obtained. Prior Anticoagulants: The patient has                            taken no anticoagulant or antiplatelet agents. ASA                            Grade Assessment: II - A patient with mild systemic                            disease. After reviewing the risks and benefits,                            the patient was deemed in satisfactory condition to                            undergo the procedure.                           After obtaining informed consent, the colonoscope  was passed under direct vision. Throughout the                            procedure, the patient's blood pressure, pulse, and                            oxygen saturations were monitored continuously. The                            Olympus PCF-H190DL (#[id number]) Colonoscope was                            introduced through the anus and advanced to the the                            cecum,  identified by appendiceal orifice and                            ileocecal valve. The colonoscopy was performed                            without difficulty. The patient tolerated the                            procedure well. The quality of the bowel                            preparation was good. The ileocecal valve,                            appendiceal orifice, and rectum were photographed. Scope In: 11:36:51 AM Scope Out: 11:48:11 AM Scope Withdrawal Time: 0 hours 8 minutes 40 seconds  Total Procedure Duration: 0 hours 11 minutes 20 seconds  Findings:                 The perianal and digital rectal examinations were                            normal.                           A 7 mm polyp was found in the sigmoid colon. The                            polyp was sessile. The polyp was removed with a                            cold snare. Resection and retrieval were complete.                           A scattered area of mild melanosis was found in the                            entire colon.  A few small-mouthed diverticula were found in the                            sigmoid colon.                           Non-bleeding external and internal hemorrhoids were                            found during retroflexion. The hemorrhoids were                            medium-sized. Complications:            No immediate complications. Estimated Blood Loss:     Estimated blood loss was minimal. Impression:               - One 7 mm polyp in the sigmoid colon, removed with                            a cold snare. Resected and retrieved.                           - Melanosis in the colon.                           - Diverticulosis in the sigmoid colon.                           - Non-bleeding external and internal hemorrhoids. Recommendation:           - Patient has a contact number available for                            emergencies. The signs and symptoms of  potential                            delayed complications were discussed with the                            patient. Return to normal activities tomorrow.                            Written discharge instructions were provided to the                            patient.                           - Resume previous diet.                           - Continue present medications.                           - Await pathology results.                           -  Repeat colonoscopy in 5-10 years for surveillance                            based on pathology results. Napoleon Form, MD 05/23/2022 11:51:26 AM This report has been signed electronically.

## 2022-05-23 NOTE — Progress Notes (Signed)
[name]   Primary Care Physician:  Ardith Dark, MD   Reason for Procedure:  Colorectal cancer screening  Plan:    Screening colonoscopy with possible interventions as needed     HPI: Tara [name] is a very pleasant 67 y.o. female here for screening colonoscopy. Denies any nausea, vomiting, abdominal pain, melena or bright red blood per rectum  The risks and benefits as well as alternatives of endoscopic procedure(s) have been discussed and reviewed. All questions answered. The patient agrees to proceed.    Past Medical History:  Diagnosis Date   Cataract    bilateral-not a surgical candidate at this time (05/04/2022)   Cervical spine tumor    Chest pain 11/09/2005   Neg eval by cath   Costochondritis    Diabetes mellitus without complication (HCC)    on meds   Heart murmur    Birth Defect   PE (pulmonary embolism) 01/09/2005   Small   Reactive depression (situational)    Seizures (HCC) 1992   last known seizure 1992    Past Surgical History:  Procedure Laterality Date   ABDOMINAL HYSTERECTOMY  2007   CESAREAN SECTION  1982   REMOVAL OF BENIGN TUMOR - CERVICAL SPINE     Surgical Repair for Displaced Rod Fixation  2003    Prior to Admission medications   Medication Sig Start Date End Date Taking? Authorizing Provider  azelaic acid (AZELEX) 20 % cream Apply topically 2 (two) times daily. After skin is thoroughly washed and patted dry, gently but thoroughly massage a thin film of azelaic acid cream into the affected area twice daily, in the morning and evening.   Yes [provider]  b complex vitamins tablet Take 1 tablet by mouth daily.   Yes [provider]  Bisacodyl (GENTLE LAXATIVE PO) Take 1 tablet by mouth daily at 6 (six) AM.   Yes [provider]  Cholecalciferol (VITAMIN D3) 50 MCG (2000 UT) TABS Take 1 tablet by mouth daily at 6 (six) AM. SOFT GELcap   Yes [provider]   famotidine-calcium carbonate-magnesium hydroxide (PEPCID COMPLETE) 10-800-165 MG chewable tablet Chew 1 tablet by mouth daily as needed.   Yes [provider]  Magnesium 200 MG TABS Take 2 tablets by mouth daily at 6 (six) AM.   Yes [provider]  Omega-3 1000 MG CAPS Take 1 capsule by mouth daily. GEL cap   Yes [provider]  TURMERIC PO Take 1 capsule by mouth daily at 6 (six) AM.   Yes [provider]  acetaminophen (TYLENOL) 325 MG tablet Take 650 mg by mouth as needed.    [provider]  clindamycin (CLINDAGEL) 1 % gel clindamycin 1 % topical gel  APPLY THIN COAT TO AFFECTED AREA TWICE A DAY    [provider]  tirzepatide (MOUNJARO) 15 MG/0.5ML Pen Inject 15 mg into the skin once a week. 05/23/22   Ardith Dark, MD    Current Outpatient Medications  Medication Sig Dispense Refill   azelaic acid (AZELEX) 20 % cream Apply topically 2 (two) times daily. After skin is thoroughly washed and patted dry, gently but thoroughly massage a thin film of azelaic acid cream into the affected area twice daily, in the morning and evening.     b complex vitamins tablet Take 1 tablet by mouth daily.     Bisacodyl (GENTLE LAXATIVE PO) Take 1 tablet by mouth daily at 6 (six) AM.  Cholecalciferol (VITAMIN D3) 50 MCG (2000 UT) TABS Take 1 tablet by mouth daily at 6 (six) AM. SOFT GELcap     famotidine-calcium carbonate-magnesium hydroxide (PEPCID COMPLETE) 10-800-165 MG chewable tablet Chew 1 tablet by mouth daily as needed.     Magnesium 200 MG TABS Take 2 tablets by mouth daily at 6 (six) AM.     Omega-3 1000 MG CAPS Take 1 capsule by mouth daily. GEL cap     TURMERIC PO Take 1 capsule by mouth daily at 6 (six) AM.     acetaminophen (TYLENOL) 325 MG tablet Take 650 mg by mouth as needed.     clindamycin (CLINDAGEL) 1 % gel clindamycin 1 % topical gel  APPLY THIN COAT TO AFFECTED AREA TWICE A DAY     tirzepatide (MOUNJARO) 15 MG/0.5ML Pen  Inject 15 mg into the skin once a week. 6 mL 5   Current Facility-Administered Medications  Medication Dose Route Frequency Provider Last Rate Last Admin   0.9 %  sodium chloride infusion  500 mL Intravenous Continuous Sharde Gover, Eleonore Chiquito, MD        Allergies as of 05/23/2022 - Review Complete 05/23/2022  Allergen Reaction Noted   Cortisone      Family History  Problem Relation Age of Onset   Miscarriages / Stillbirths Father    Hypertension Father    Hyperlipidemia Father    Coronary artery disease Father    Diabetes Father    Cancer Sister    Depression Sister    Heart attack Brother    Depression Brother    Diabetes Brother    Hypertension Brother    Kidney disease Brother    Diabetes Brother        Severe complication including renal failure, amputation   Drug abuse Brother    Heart disease Brother    Heart attack Brother    Depression Brother    Diabetes Brother    Hyperlipidemia Brother    Colon cancer Paternal Aunt    Breast cancer Paternal Aunt        10s   Breast cancer Daughter 57   Kidney failure Daughter    Colon polyps Neg Hx    Esophageal cancer Neg Hx    Stomach cancer Neg Hx    Rectal cancer Neg Hx     Social History   Socioeconomic History   Marital status: Widowed    Spouse name: Not on file   Number of children: Not on file   Years of education: Not on file   Highest education level: Not on file  Occupational History   Occupation: Chemical engineer: [employer]  Tobacco Use   Smoking status: Never   Smokeless tobacco: Never  Vaping Use   Vaping Use: Never used  Substance and Sexual Activity   Alcohol use: No   Drug use: No   Sexual activity: Yes  Other Topics Concern   Not on file  Social History Narrative   [employer] [place] - [place]   Married 1977   2 daughters - '82, '85; 1 son - '91   Social Determinants of Health   Financial Resource Strain: Low Risk  (10/13/2021)   Overall Financial Resource Strain  (CARDIA)    Difficulty of Paying Living Expenses: Not hard at all  Food Insecurity: No Food Insecurity (10/13/2021)   Hunger Vital Sign    Worried About Running Out of Food in the Last Year: Never true  Ran Out of Food in the Last Year: Never true  Transportation Needs: No Transportation Needs (10/13/2021)   PRAPARE - Administrator, Civil Service (Medical): No    Lack of Transportation (Non-Medical): No  Physical Activity: Inactive (10/13/2021)   Exercise Vital Sign    Days of Exercise per Week: 0 days    Minutes of Exercise per Session: 0 min  Stress: Stress Concern Present (10/13/2021)   [employer] of Occupational Health - Occupational Stress Questionnaire    Feeling of Stress : To some extent  Social Connections: Moderately Integrated (10/13/2021)   Social Connection and Isolation Panel [NHANES]    Frequency of Communication with Friends and Family: More than three times a week    Frequency of Social Gatherings with Friends and Family: Three times a week    Attends Religious Services: More than 4 times per year    Active Member of Clubs or Organizations: Yes    Attends Banker Meetings: 1 to 4 times per year    Marital Status: Widowed  Intimate Partner Violence: Not At Risk (10/13/2021)   Humiliation, Afraid, Rape, and Kick questionnaire    Fear of Current or Ex-Partner: No    Emotionally Abused: No    Physically Abused: No    Sexually Abused: No    Review of Systems:  All other review of systems negative except as mentioned in the HPI.  Physical Exam: Vital signs in last 24 hours: Blood Pressure (Abnormal) 159/106   Pulse 82   Temperature 98 F (36.7 C) (Skin)   Height 5\' 4"  (1.626 m)   Weight 200 lb (90.7 kg)   Oxygen Saturation 96%   Body Mass Index 34.33 kg/m  General:   Alert, NAD Lungs:  Clear .   Heart:  Regular rate and rhythm Abdomen:  Soft, nontender and nondistended. Neuro/Psych:  Alert and cooperative. Normal mood and  affect. A and O x 3  Reviewed labs, radiology imaging, old records and pertinent past GI work up  Patient is appropriate for planned procedure(s) and anesthesia in an ambulatory setting   K. Scherry Ran , MD [phone]

## 2022-05-24 ENCOUNTER — Telehealth: Payer: Self-pay

## 2022-05-24 NOTE — Telephone Encounter (Signed)
  Follow up Call-     05/23/2022   10:54 AM  Call back number  Post procedure Call Back phone  # [phone]  Permission to leave phone message Yes     Patient questions:  Do you have a fever, pain , or abdominal swelling? No. Pain Score  0 *  Have you tolerated food without any problems? Yes.    Have you been able to return to your normal activities? Yes.    Do you have any questions about your discharge instructions: Diet   No. Medications  No. Follow up visit  No.  Do you have questions or concerns about your Care? No.  Actions: * If pain score is 4 or above: No action needed, pain <4.

## 2022-06-06 ENCOUNTER — Encounter: Payer: Self-pay | Admitting: Gastroenterology

## 2022-07-05 ENCOUNTER — Telehealth: Payer: Self-pay | Admitting: Family Medicine

## 2022-07-05 NOTE — Telephone Encounter (Signed)
Notes

## 2022-07-05 NOTE — Telephone Encounter (Signed)
Pt states her mail in pharmacy is out of  tirzepatide [facility]) 15 MG/0.5ML Pen  Please [facility] RX to  [id number] [place], [place] - [address] Phone: [phone] Fax: [phone]  She also states this is an FYI for next refill, she does not need on at this time.

## 2022-07-06 NOTE — Telephone Encounter (Signed)
Patient called for an update. Patient is requesting a call on what to do in this situation. States she would like to keep taking mounjaro but insurance quoted her $70 if filled in store verus the $40 she was paying through [employer]. Due to this she is open to recommendations if they are just as good and more affordable.

## 2022-07-07 ENCOUNTER — Other Ambulatory Visit (HOSPITAL_COMMUNITY): Payer: Self-pay

## 2022-07-07 MED ORDER — TIRZEPATIDE 15 MG/0.5ML ~~LOC~~ SOAJ
15.0000 mg | SUBCUTANEOUS | 0 refills | Status: DC
  Filled 2022-07-07 – 2022-07-26 (×2): qty 2, 28d supply, fill #0

## 2022-07-07 MED ORDER — TIRZEPATIDE 15 MG/0.5ML ~~LOC~~ SOAJ: 15.0000 mg | SUBCUTANEOUS | 0 refills | Status: DC

## 2022-07-07 NOTE — Telephone Encounter (Signed)
Spoke to pt asked her how I can help. Pt said she wanted to know what other options there are for her to get [facility] since mail order does not have medication. Told her the only thing I can do is [facility] and if they do not have send to a [facility] pharmacy. Pt verbalized understanding and asked if I knew the cost? Told her no, you would have to call and see how much. Pt verbalized understanding and asked Rx for [facility] be sent to both pharmacy's. Told her okay will send. Rx sent to both pharmacy.

## 2022-07-07 NOTE — Addendum Note (Signed)
Addended by: Jimmye Norman on: 07/07/2022 09:41 AM   Modules accepted: Orders

## 2022-07-18 ENCOUNTER — Other Ambulatory Visit (HOSPITAL_COMMUNITY): Payer: Self-pay

## 2022-07-26 ENCOUNTER — Other Ambulatory Visit (HOSPITAL_COMMUNITY): Payer: Self-pay

## 2022-08-24 ENCOUNTER — Encounter (INDEPENDENT_AMBULATORY_CARE_PROVIDER_SITE_OTHER): Payer: Self-pay

## 2022-09-26 ENCOUNTER — Ambulatory Visit (INDEPENDENT_AMBULATORY_CARE_PROVIDER_SITE_OTHER): Payer: Medicare Other | Admitting: Family Medicine

## 2022-09-26 ENCOUNTER — Encounter: Payer: Self-pay | Admitting: Family Medicine

## 2022-09-26 VITALS — BP 138/60 | HR 72 | Temp 97.5°F | Ht 64.0 in | Wt 200.0 lb

## 2022-09-26 DIAGNOSIS — E559 Vitamin D deficiency, unspecified: Secondary | ICD-10-CM

## 2022-09-26 DIAGNOSIS — Z7985 Long-term (current) use of injectable non-insulin antidiabetic drugs: Secondary | ICD-10-CM

## 2022-09-26 DIAGNOSIS — E119 Type 2 diabetes mellitus without complications: Secondary | ICD-10-CM

## 2022-09-26 DIAGNOSIS — E785 Hyperlipidemia, unspecified: Secondary | ICD-10-CM | POA: Diagnosis not present

## 2022-09-26 DIAGNOSIS — F439 Reaction to severe stress, unspecified: Secondary | ICD-10-CM

## 2022-09-26 DIAGNOSIS — E669 Obesity, unspecified: Secondary | ICD-10-CM

## 2022-09-26 DIAGNOSIS — Z6834 Body mass index (BMI) 34.0-34.9, adult: Secondary | ICD-10-CM

## 2022-09-26 LAB — LIPID PANEL
Cholesterol: 223 mg/dL — ABNORMAL HIGH (ref 0–200)
HDL: 47.7 mg/dL (ref >39.00)
LDL Cholesterol: 148 mg/dL — ABNORMAL HIGH (ref 0–99)
NonHDL: 175.5
Total CHOL/HDL Ratio: 5
Triglycerides: 138 mg/dL (ref 0.0–149.0)
VLDL: 27.6 mg/dL (ref 0.0–40.0)

## 2022-09-26 LAB — CBC
HCT: 43.1 % (ref 36.0–46.0)
Hemoglobin: 14.2 g/dL (ref 12.0–15.0)
MCHC: 32.9 g/dL (ref 30.0–36.0)
MCV: 89.4 fl (ref 78.0–100.0)
Platelets: 272 10*3/uL (ref 150.0–400.0)
RBC: 4.83 Mil/uL (ref 3.87–5.11)
RDW: 14 % (ref 11.5–15.5)
WBC: 7.5 10*3/uL (ref 4.0–10.5)

## 2022-09-26 LAB — COMPREHENSIVE METABOLIC PANEL
ALT: 15 U/L (ref 0–35)
AST: 14 U/L (ref 0–37)
Albumin: 3.8 g/dL (ref 3.5–5.2)
Alkaline Phosphatase: 76 U/L (ref 39–117)
BUN: 11 mg/dL (ref 6–23)
CO2: 25 meq/L (ref 19–32)
Calcium: 8.8 mg/dL (ref 8.4–10.5)
Chloride: 106 meq/L (ref 96–112)
Creatinine, Ser: 0.77 mg/dL (ref 0.40–1.20)
GFR: 79.9 mL/min (ref >60.00)
Glucose, Bld: 124 mg/dL — ABNORMAL HIGH (ref 70–99)
Potassium: 3.8 meq/L (ref 3.5–5.1)
Sodium: 141 meq/L (ref 135–145)
Total Bilirubin: 0.5 mg/dL (ref 0.2–1.2)
Total Protein: 6.8 g/dL (ref 6.0–8.3)

## 2022-09-26 LAB — MICROALBUMIN / CREATININE URINE RATIO
Creatinine,U: 180.4 mg/dL
Microalb Creat Ratio: 0.6 mg/g (ref 0.0–30.0)
Microalb, Ur: 1.1 mg/dL (ref 0.0–1.9)

## 2022-09-26 LAB — POCT GLYCOSYLATED HEMOGLOBIN (HGB A1C): Hemoglobin A1C: 5.4 % (ref 4.0–5.6)

## 2022-09-26 LAB — TSH: TSH: 2.57 u[IU]/mL (ref 0.35–5.50)

## 2022-09-26 MED ORDER — TIRZEPATIDE 15 MG/0.5ML ~~LOC~~ SOAJ: 15.0000 mg | SUBCUTANEOUS | 4 refills | Status: DC

## 2022-09-26 NOTE — Patient Instructions (Signed)
It was very nice to see you today!  We will check blood work today.  Your A1c looks great.  We will continue with your Mounjaro.  Please continue to work on diet and exercise.  Return in about 6 months (around 03/26/2023) for Follow Up.   Take care, Dr Jimmey Ralph  PLEASE NOTE:  If you had any lab tests, please let us know if you have not heard back within a few days. You may see your results on mychart before we have a chance to review them but we will give you a call once they are reviewed by [place].   If we ordered any referrals today, please let us know if you have not heard from their office within the next week.   If you had any urgent prescriptions sent in today, please check with the pharmacy within an hour of our visit to make sure the prescription was transmitted appropriately.   Please try these tips to maintain a healthy lifestyle:  Eat at least 3 REAL meals and 1-2 snacks per day.  Aim for no more than 5 hours between eating.  If you eat breakfast, please do so within one hour of getting up.   Each meal should contain half fruits/vegetables, one quarter protein, and one quarter carbs (no bigger than a computer mouse)  Cut down on sweet beverages. This includes juice, soda, and sweet tea.   Drink at least 1 glass of water with each meal and aim for at least 8 glasses per day  Exercise at least 150 minutes every week.

## 2022-09-26 NOTE — Assessment & Plan Note (Signed)
She has been under more stress recently due to her daughter recently being diagnosed with recurrent breast cancer.  Overall she feels like she is handling it as well as can be expected.  She will let us know if she would like to discuss medications or therapist referral.

## 2022-09-26 NOTE — Progress Notes (Signed)
   Tara [name] is a 67 y.o. female who presents today for an office visit.  Assessment/Plan:  Chronic Problems Addressed Today: Controlled type 2 diabetes mellitus without complication, without long-term current use of insulin (HCC) A1c well-controlled 5.4.  Continue Mounjaro 15 mg weekly.  She is having mild constipation but this is manageable.  She will continue to work on diet and exercise.  Recheck A1c in 6 months.  Stress She has been under more stress recently due to her daughter recently being diagnosed with recurrent breast cancer.  Overall she feels like she is handling it as well as can be expected.  She will let us know if she would like to discuss medications or therapist referral.   Obesity Weight stable since last visit.  Continue Mounjaro 15 mg weekly.  She will continue to work on diet and exercise.  Dyslipidemia Check lipids.  Preventative health care Declined pneumonia vaccine.  She will get COVID and flu later this year at the pharmacy.  Check labs today.  Up-to-date on mammogram and colon cancer screening.    Subjective:  HPI:  See Assessment / plan for status of chronic conditions. She was last here about 6 months ago. At that time we increased her Mounjaro to 15 mg weekly. She has done well with this. She  has been under a lot more stress recently due to her daughter being diagnosed with recurrent breast cancer.       Objective:  Physical Exam: BP 138/60   Pulse 72   Temp (!) 97.5 F (36.4 C) (Temporal)   Ht 5\' 4"  (1.626 m)   Wt 200 lb (90.7 kg)   SpO2 96%   BMI 34.33 kg/m   Wt Readings from Last 3 Encounters:  09/26/22 200 lb (90.7 kg)  05/23/22 200 lb (90.7 kg)  05/04/22 200 lb (90.7 kg)  Gen: No acute distress, resting comfortably CV: Regular rate and rhythm with no murmurs appreciated Pulm: Normal work of breathing, clear to auscultation bilaterally with no crackles, wheezes, or rhonchi Neuro: Grossly normal, moves all extremities Psych:  Normal affect and thought content      [name] M. Jimmey Ralph, MD 09/26/2022 8:29 AM

## 2022-09-26 NOTE — Assessment & Plan Note (Signed)
Weight stable since last visit.  Continue Mounjaro 15 mg weekly.  She will continue to work on diet and exercise.

## 2022-09-26 NOTE — Assessment & Plan Note (Signed)
A1c well-controlled 5.4.  Continue Mounjaro 15 mg weekly.  She is having mild constipation but this is manageable.  She will continue to work on diet and exercise.  Recheck A1c in 6 months.

## 2022-09-26 NOTE — Assessment & Plan Note (Signed)
Check lipids

## 2022-09-29 NOTE — Progress Notes (Signed)
Her cholesterol level is better than last year but still elevated.  We can try low-dose statin simvastatin 40 mg daily to help improve his numbers and lower risk of heart attack and stroke.  Please send in if she is agreeable.  The rest of her labs are all stable.  We can recheck everything in a year.

## 2022-10-16 ENCOUNTER — Other Ambulatory Visit (HOSPITAL_COMMUNITY): Payer: Self-pay

## 2022-10-16 ENCOUNTER — Telehealth: Payer: Self-pay

## 2022-10-16 NOTE — Telephone Encounter (Signed)
Pharmacy Patient Advocate Encounter   Received notification from CoverMyMeds that prior authorization for Mounjaro 2.5MG /0.5ML auto-injectors is required/requested.   Insurance verification completed.   The patient is insured through [facility] .   Per test claim: CANCELLED due to: The patient currently has access to the requested medication and a Prior Authorization is not needed for the patient/medication.  Per test claim: Refill too soon. PA is not needed at this time. Medication was filled 09/27/22. Next eligible fill date is 11/29/22.   PRIOR AUTHORIZATION EXPIRES ON 07/12/23

## 2022-10-19 ENCOUNTER — Encounter: Payer: Self-pay | Admitting: Family Medicine

## 2022-12-18 LAB — HM DIABETES EYE EXAM

## 2023-01-25 ENCOUNTER — Ambulatory Visit: Payer: Medicare Other

## 2023-01-25 VITALS — Wt 200.0 lb

## 2023-01-25 DIAGNOSIS — Z0000 Encounter for general adult medical examination without abnormal findings: Secondary | ICD-10-CM | POA: Diagnosis not present

## 2023-01-25 NOTE — Progress Notes (Addendum)
Subjective:   Tara [name] is a 68 y.o. female who presents for Medicare Annual (Subsequent) preventive examination.  Visit Complete: Virtual I connected with  Tara [name] on 01/25/23 by a video and audio enabled telemedicine application and verified that I am speaking with the correct person using two identifiers.  Patient Location: Home  Provider Location: Home Office  I discussed the limitations of evaluation and management by telemedicine. The patient expressed understanding and agreed to proceed.  Vital Signs: Because this visit was a virtual/telehealth visit, some criteria may be missing or patient reported. Any vitals not documented were not able to be obtained and vitals that have been documented are patient reported.  Patient Medicare AWV questionnaire was completed by the patient on 01/21/23; I have confirmed that all information answered by patient is correct and no changes since this date.  Cardiac Risk Factors include: advanced age (>32men, >75 women);diabetes mellitus;dyslipidemia;obesity (BMI >30kg/m2)     Objective:    Today's Vitals   01/25/23 1116  Weight: 200 lb (90.7 kg)   Body mass index is 34.33 kg/m.     01/25/2023   11:28 AM 10/13/2021    1:49 PM  Advanced Directives  Does Patient Have a Medical Advance Directive? Yes Yes  Type of Estate agent of [place];Living will Healthcare Power of [place];Living will  Copy of Healthcare Power of Attorney in Chart? No - copy requested No - copy requested    Current Medications (verified) Outpatient Encounter Medications as of 01/25/2023  Medication Sig   azelaic acid (AZELEX) 20 % cream Apply topically 2 (two) times daily. After skin is thoroughly washed and patted dry, gently but thoroughly massage a thin film of azelaic acid cream into the affected area twice daily, in the morning and evening.   b complex vitamins tablet Take 1 tablet by mouth daily.   Bisacodyl (GENTLE  LAXATIVE PO) Take 1 tablet by mouth daily at 6 (six) AM.   Cholecalciferol (VITAMIN D3) 50 MCG (2000 UT) TABS Take 1 tablet by mouth daily at 6 (six) AM. SOFT GELcap   clindamycin (CLINDAGEL) 1 % gel clindamycin 1 % topical gel  APPLY THIN COAT TO AFFECTED AREA TWICE A DAY   Magnesium 200 MG TABS Take 2 tablets by mouth daily at 6 (six) AM.   Omega-3 1000 MG CAPS Take 1 capsule by mouth daily. GEL cap   tirzepatide (MOUNJARO) 15 MG/0.5ML Pen Inject 15 mg into the skin once a week.   TURMERIC PO Take 1 capsule by mouth daily at 6 (six) AM.   famotidine-calcium carbonate-magnesium hydroxide (PEPCID COMPLETE) 10-800-165 MG chewable tablet Chew 1 tablet by mouth daily as needed. (Patient not taking: Reported on 01/25/2023)   [DISCONTINUED] acetaminophen (TYLENOL) 325 MG tablet Take 650 mg by mouth as needed. (Patient not taking: Reported on 09/26/2022)   Facility-Administered Encounter Medications as of 01/25/2023  Medication   0.9 %  sodium chloride infusion    Allergies (verified) Cortisone   History: Past Medical History:  Diagnosis Date   Cataract    bilateral-not a surgical candidate at this time (05/04/2022)   Cervical spine tumor    Chest pain 11/09/2005   Neg eval by cath   Costochondritis    Diabetes mellitus without complication (HCC)    on meds   Heart murmur    Birth Defect   PE (pulmonary embolism) 01/09/2005   Small   Reactive depression (situational)    Seizures (HCC) 1992   last known  seizure 1992   Past Surgical History:  Procedure Laterality Date   ABDOMINAL HYSTERECTOMY  2007   CESAREAN SECTION  1982   REMOVAL OF BENIGN TUMOR - CERVICAL SPINE     Surgical Repair for Displaced Rod Fixation  2003   Family History  Problem Relation Age of Onset   Miscarriages / Stillbirths Father    Hypertension Father    Hyperlipidemia Father    Coronary artery disease Father    Diabetes Father    Cancer Sister    Depression Sister    Heart attack Brother    Depression  Brother    Diabetes Brother    Hypertension Brother    Kidney disease Brother    Diabetes Brother        Severe complication including renal failure, amputation   Drug abuse Brother    Heart disease Brother    Heart attack Brother    Depression Brother    Diabetes Brother    Hyperlipidemia Brother    Colon cancer Paternal Aunt    Breast cancer Paternal Aunt        54s   Breast cancer Daughter 24   Kidney failure Daughter    Colon polyps Neg Hx    Esophageal cancer Neg Hx    Stomach cancer Neg Hx    Rectal cancer Neg Hx    Social History   Socioeconomic History   Marital status: Widowed    Spouse name: Not on file   Number of children: Not on file   Years of education: Not on file   Highest education level: Not on file  Occupational History   Occupation: Chemical engineer: [employer]  Tobacco Use   Smoking status: Never   Smokeless tobacco: Never  Vaping Use   Vaping status: Never Used  Substance and Sexual Activity   Alcohol use: No   Drug use: No   Sexual activity: Yes  Other Topics Concern   Not on file  Social History Narrative   [place] [place] [employer] [place] - [place]   Married 1977   2 daughters - '82, '85; 1 son - '91   Social Drivers of Corporate investment banker Strain: Low Risk  (01/25/2023)   Overall Financial Resource Strain (CARDIA)    Difficulty of Paying Living Expenses: Not hard at all  Food Insecurity: No Food Insecurity (01/25/2023)   Hunger Vital Sign    Worried About Running Out of Food in the Last Year: Never true    Ran Out of Food in the Last Year: Never true  Transportation Needs: No Transportation Needs (01/25/2023)   PRAPARE - Administrator, Civil Service (Medical): No    Lack of Transportation (Non-Medical): No  Physical Activity: Inactive (01/25/2023)   Exercise Vital Sign    Days of Exercise per Week: 0 days    Minutes of Exercise per Session: 0 min  Stress: No Stress Concern Present (01/25/2023)   [employer] of Occupational Health - Occupational Stress Questionnaire    Feeling of Stress : Only a little  Social Connections: Moderately Integrated (01/25/2023)   Social Connection and Isolation Panel [NHANES]    Frequency of Communication with Friends and Family: Three times a week    Frequency of Social Gatherings with Friends and Family: Three times a week    Attends Religious Services: More than 4 times per year    Active Member of Clubs or Organizations: Yes    Attends  Club or Organization Meetings: 1 to 4 times per year    Marital Status: Widowed    Tobacco Counseling Counseling given: Not Answered   Clinical Intake:  Pre-visit preparation completed: Yes  Pain : No/denies pain     BMI - recorded: 34.33 Nutritional Status: BMI > 30  Obese Nutritional Risks: None Diabetes: Yes CBG done?: No Did pt. bring in CBG monitor from home?: No  How often do you need to have someone help you when you read instructions, pamphlets, or other written materials from your doctor or pharmacy?: 1 - Never  Interpreter Needed?: No  Information entered by :: Lanier Ensign, LPN   Activities of Daily Living    01/21/2023   11:27 AM  In your present state of health, do you have any difficulty performing the following activities:  Hearing? 0  Vision? 0  Difficulty concentrating or making decisions? 0  Walking or climbing stairs? 0  Dressing or bathing? 0  Doing errands, shopping? 0  Preparing Food and eating ? N  Using the Toilet? N  In the past six months, have you accidently leaked urine? Y  Comment wears a pad  Do you have problems with loss of bowel control? N  Managing your Medications? N  Managing your Finances? N  Housekeeping or managing your Housekeeping? N    Patient Care Team: Ardith Dark, MD as PCP - General (Family Medicine)  Indicate any recent Medical Services you may have received from other than [facility] providers in the past year (date may be  approximate).     Assessment:   This is a routine wellness examination for [facility].  Hearing/Vision screen Hearing Screening - Comments:: Pt denies any hearing issues  Vision Screening - Comments:: Pt follows up with [facility] for [facility] exams    Goals Addressed             This Visit's Progress    Patient Stated       Lose weight        Depression Screen    01/25/2023   11:25 AM 09/26/2022    7:53 AM 03/28/2022    7:42 AM 12/27/2021    7:36 AM 10/13/2021    1:48 PM 10/06/2021   11:01 AM 10/06/2021   11:00 AM  PHQ 2/9 Scores  PHQ - 2 Score 1 0 0 0 0 0 0    Fall Risk    01/21/2023   11:27 AM 09/26/2022    7:53 AM 03/28/2022    7:42 AM 12/27/2021    7:36 AM 10/13/2021    1:50 PM  Fall Risk   Falls in the past year? 0 0 0 0 0  Number falls in past yr: 0 0 0 0 0  Injury with Fall? 0 0 0 0 0  Risk for fall due to : Impaired mobility No Fall Risks No Fall Risks No Fall Risks Impaired vision  Follow up Falls prevention discussed Falls evaluation completed   Falls prevention discussed    MEDICARE RISK AT HOME: Medicare Risk at Home Any stairs in or around the home?: (Patient-Rptd) Yes If so, are there any without handrails?: (Patient-Rptd) No Home free of loose throw rugs in walkways, pet beds, electrical cords, etc?: (Patient-Rptd) Yes Adequate lighting in your home to reduce risk of falls?: (Patient-Rptd) Yes Life alert?: (Patient-Rptd) No Use of a cane, walker or w/c?: (Patient-Rptd) No Grab bars in the bathroom?: (Patient-Rptd) Yes Shower chair or bench in shower?: (  Patient-Rptd) No Elevated toilet seat or a handicapped toilet?: (Patient-Rptd) No  TIMED UP AND GO:  Was the test performed?  No    Cognitive Function:        01/25/2023   11:29 AM 10/13/2021    1:52 PM  6CIT Screen  What Year? 0 points 0 points  What month? 0 points 0 points  What time? 0 points 0 points  Count back from 20 0 points 0 points  Months in reverse 0 points 0  points  Repeat phrase 0 points 0 points  Total Score 0 points 0 points    Immunizations Immunization History  Administered Date(s) Administered   Influenza-Unspecified 10/19/2022   Janssen (J&J) SARS-COV-2 Vaccination 04/15/2019   Moderna Covid-19 Fall Seasonal Vaccine 17yrs & older 11/01/2021   PNEUMOCOCCAL CONJUGATE-20 10/19/2022   Pfizer Covid-19 Vaccine Bivalent Booster 95yrs & up 11/07/2019, 07/22/2020   Pfizer Covid-19 Vaccine Bivalent Booster 5y-11y 11/15/2020   Pfizer(Comirnaty)Fall Seasonal Vaccine 12 years and older 10/19/2022   Tdap 02/24/2014   Zoster Recombinant(Shingrix) 01/25/2021, 05/02/2021    TDAP status: Up to date  Flu Vaccine status: Up to date  Pneumococcal vaccine status: Declined,  Education has been provided regarding the importance of this vaccine but patient still declined. Advised may receive this vaccine at local pharmacy or Health Dept. Aware to provide a copy of the vaccination record if obtained from local pharmacy or Health Dept. Verbalized acceptance and understanding.   Covid-19 vaccine status: Completed vaccines  Qualifies for Shingles Vaccine? Yes   Zostavax completed Yes   Shingrix Completed?: Yes  Screening Tests Health Maintenance  Topic Date Due   FOOT EXAM  Never done   OPHTHALMOLOGY EXAM  12/13/2022   DEXA SCAN  01/26/2023 (Originally 06/15/2020)   HEMOGLOBIN A1C  03/26/2023   MAMMOGRAM  04/25/2023   Diabetic kidney evaluation - eGFR measurement  09/26/2023   Diabetic kidney evaluation - Urine ACR  09/26/2023   Medicare Annual Wellness (AWV)  01/25/2024   DTaP/Tdap/Td (2 - Td or Tdap) 02/25/2024   Colonoscopy  05/22/2029   INFLUENZA VACCINE  Completed   COVID-19 Vaccine  Completed   Hepatitis C Screening  Completed   Zoster Vaccines- Shingrix  Completed   HPV VACCINES  Aged Out   Pneumonia Vaccine 68+ Years old  Discontinued    Health Maintenance  Health Maintenance Due  Topic Date Due   FOOT EXAM  Never done    OPHTHALMOLOGY EXAM  12/13/2022    Colorectal cancer screening: Type of screening: Colonoscopy. Completed 05/23/22. Repeat every 7 years  Mammogram status: Completed 04/25/22. Repeat every year  Pt declined bone density at this time    Additional Screening:  Hepatitis C Screening: Completed 08/30/15  Vision Screening: Recommended annual ophthalmology exams for early detection of glaucoma and other disorders of the eye. Is the patient up to date with their annual eye exam?  Yes  Who is the provider or what is the name of the office in which the patient attends annual eye exams? [facility]  If pt is not established with a provider, would they like to be referred to a provider to establish care? No .   Dental Screening: Recommended annual dental exams for proper oral hygiene  Diabetic Foot Exam: Diabetic Foot Exam: Overdue, Pt has been advised about the importance in completing this exam. Pt is scheduled for diabetic foot exam on next appt.  Community Resource Referral / Chronic Care Management: CRR required this visit?  No  CCM required this visit?  No     Plan:     I have personally reviewed and noted the following in the patient's chart:   Medical and social history Use of alcohol, tobacco or illicit drugs  Current medications and supplements including opioid prescriptions. Patient is not currently taking opioid prescriptions. Functional ability and status Nutritional status Physical activity Advanced directives List of other physicians Hospitalizations, surgeries, and ER visits in previous 12 months Vitals Screenings to include cognitive, depression, and falls Referrals and appointments  In addition, I have reviewed and discussed with patient certain preventive protocols, quality metrics, and best practice recommendations. A written personalized care plan for preventive services as well as general preventive health recommendations were provided to patient.      Marzella Schlein, LPN   [id number]   After Visit Summary: (MyChart) Due to this being a telephonic visit, the after visit summary with patients personalized plan was offered to patient via MyChart   Nurse Notes: none

## 2023-01-25 NOTE — Patient Instructions (Signed)
Tara [name] , Thank you for taking time to come for your Medicare Wellness Visit. I appreciate your ongoing commitment to your health goals. Please review the following plan we discussed and let me know if I can assist you in the future.   Referrals/Orders/Follow-Ups/Clinician Recommendations: Aim for 30 minutes of exercise or brisk walking, 6-8 glasses of water, and 5 servings of fruits and vegetables each day. Continue to work on losing weight   This is a list of the screening recommended for you and due dates:  Health Maintenance  Topic Date Due   Complete foot exam   Never done   DEXA scan (bone density measurement)  Never done   Eye exam for diabetics  12/13/2022   Hemoglobin A1C  03/26/2023   Mammogram  04/25/2023   Yearly kidney function blood test for diabetes  09/26/2023   Yearly kidney health urinalysis for diabetes  09/26/2023   Medicare Annual Wellness Visit  01/25/2024   DTaP/Tdap/Td vaccine (2 - Td or Tdap) 02/25/2024   Colon Cancer Screening  05/22/2029   Flu Shot  Completed   COVID-19 Vaccine  Completed   Hepatitis C Screening  Completed   Zoster (Shingles) Vaccine  Completed   HPV Vaccine  Aged Out   Pneumonia Vaccine  Discontinued    Advanced directives: (Copy Requested) Please bring a copy of your health care power of attorney and living will to the office to be added to your chart at your convenience.  Next Medicare Annual Wellness Visit scheduled for next year: Yes

## 2023-02-28 ENCOUNTER — Other Ambulatory Visit: Payer: Self-pay | Admitting: Obstetrics and Gynecology

## 2023-02-28 DIAGNOSIS — R928 Other abnormal and inconclusive findings on diagnostic imaging of breast: Secondary | ICD-10-CM

## 2023-03-29 ENCOUNTER — Ambulatory Visit (INDEPENDENT_AMBULATORY_CARE_PROVIDER_SITE_OTHER): Payer: Medicare Other | Admitting: Family Medicine

## 2023-03-29 VITALS — BP 138/82 | HR 88 | Temp 97.3°F | Ht 64.0 in | Wt 206.5 lb

## 2023-03-29 DIAGNOSIS — E119 Type 2 diabetes mellitus without complications: Secondary | ICD-10-CM | POA: Diagnosis not present

## 2023-03-29 DIAGNOSIS — F439 Reaction to severe stress, unspecified: Secondary | ICD-10-CM

## 2023-03-29 DIAGNOSIS — Z7985 Long-term (current) use of injectable non-insulin antidiabetic drugs: Secondary | ICD-10-CM | POA: Diagnosis not present

## 2023-03-29 DIAGNOSIS — E2839 Other primary ovarian failure: Secondary | ICD-10-CM | POA: Diagnosis not present

## 2023-03-29 LAB — POCT GLYCOSYLATED HEMOGLOBIN (HGB A1C): Hemoglobin A1C: 5.6 % (ref 4.0–5.6)

## 2023-03-29 LAB — MICROALBUMIN / CREATININE URINE RATIO
Creatinine,U: 167.8 mg/dL
Microalb Creat Ratio: 15.2 mg/g (ref 0.0–30.0)
Microalb, Ur: 2.6 mg/dL — ABNORMAL HIGH (ref 0.0–1.9)

## 2023-03-29 NOTE — Patient Instructions (Signed)
 It was very nice to see you today!  Your A1c today is 5.6.  Will continue with your current medications.  Will check a urine sample today.  I will order a bone density scan.  You should be able to get this done at the same time as her mammogram.  You can continue taking Benadryl as needed at night to help with sleep.  You can also try using Unisom.  Please let us know if you need any further assistance with stress management.  We will see you back in 6 months.  We can check blood work at that visit.  Please come back to see [place] sooner if needed.  Return in about 6 months (around 09/29/2023) for Annual Physical.   Take care, Dr Jimmey Ralph  PLEASE NOTE:  If you had any lab tests, please let us know if you have not heard back within a few days. You may see your results on mychart before we have a chance to review them but we will give you a call once they are reviewed by [place].   If we ordered any referrals today, please let us know if you have not heard from their office within the next week.   If you had any urgent prescriptions sent in today, please check with the pharmacy within an hour of our visit to make sure the prescription was transmitted appropriately.   Please try these tips to maintain a healthy lifestyle:  Eat at least 3 REAL meals and 1-2 snacks per day.  Aim for no more than 5 hours between eating.  If you eat breakfast, please do so within one hour of getting up.   Each meal should contain half fruits/vegetables, one quarter protein, and one quarter carbs (no bigger than a computer mouse)  Cut down on sweet beverages. This includes juice, soda, and sweet tea.   Drink at least 1 glass of water with each meal and aim for at least 8 glasses per day  Exercise at least 150 minutes every week.

## 2023-03-29 NOTE — Progress Notes (Signed)
   Tara [name] is a 68 y.o. female who presents today for an office visit.  Assessment/Plan:  Chronic Problems Addressed Today: Controlled type 2 diabetes mellitus without complication, without long-term current use of insulin (HCC) A1c well-controlled 5.6.  Will continue Mounjaro 15 mg weekly.  No significant side effects.  She is working on lifestyle modifications.  Recheck A1c in 6 months.  Stress She has been under more stress recently due to her daughter's recurrence of breast cancer.  Overall her stress levels are manageable though she is having a bit more insomnia.  We did discuss medications and therapy referral however she would like to hold off on this for now.  She is managing her insomnia with Benadryl nightly as needed.  She can continue with this.  Also discussed trial of Unisom.  Overall she does feel like symptoms are improving.  She will let us know if she needs any further assistance.    Preventative health care Check UACR and DEXA    Subjective:  HPI:  See Assessment / plan for status of chronic conditions.  She has been under more stress recently due to her daughter's breast cancer recurrence.  She is having some difficulty with sleeping at night due to this.  Usually sleep about 5 to 6 hours.  She has tried taking Benadryl which does seem to help.  Overall she does feel like her stress is manageable.       Objective:  Physical Exam: BP 138/82   Pulse 88   Temp (!) 97.3 F (36.3 C) (Temporal)   Ht 5\' 4"  (1.626 m)   Wt 206 lb 8 oz (93.7 kg)   SpO2 97%   BMI 35.45 kg/m   Wt Readings from Last 3 Encounters:  03/29/23 206 lb 8 oz (93.7 kg)  01/25/23 200 lb (90.7 kg)  09/26/22 200 lb (90.7 kg)    Gen: No acute distress, resting comfortably Neuro: Grossly normal, moves all extremities Psych: Normal affect and thought content      Tara [name] M. Jimmey Ralph, MD 03/29/2023 9:10 AM

## 2023-03-29 NOTE — Assessment & Plan Note (Addendum)
 She has been under more stress recently due to her daughter's recurrence of breast cancer.  Overall her stress levels are manageable though she is having a bit more insomnia.  We did discuss medications and therapy referral however she would like to hold off on this for now.  She is managing her insomnia with Benadryl nightly as needed.  She can continue with this.  Also discussed trial of Unisom.  Overall she does feel like symptoms are improving.  She will let us know if she needs any further assistance.

## 2023-03-29 NOTE — Assessment & Plan Note (Signed)
 A1c well-controlled 5.6.  Will continue Mounjaro 15 mg weekly.  No significant side effects.  She is working on lifestyle modifications.  Recheck A1c in 6 months.

## 2023-04-02 ENCOUNTER — Encounter: Payer: Self-pay | Admitting: Family Medicine

## 2023-04-02 NOTE — Progress Notes (Signed)
 Urine test is normal. We can check again next year.

## 2023-04-25 ENCOUNTER — Other Ambulatory Visit: Payer: Self-pay | Admitting: Obstetrics and Gynecology

## 2023-04-25 DIAGNOSIS — N6459 Other signs and symptoms in breast: Secondary | ICD-10-CM

## 2023-05-01 ENCOUNTER — Ambulatory Visit
Admission: RE | Admit: 2023-05-01 | Discharge: 2023-05-01 | Disposition: A | Discharge status: Home / Self Care | Payer: Medicare Other | Source: Ambulatory Visit | Attending: Obstetrics and Gynecology | Admitting: Obstetrics and Gynecology

## 2023-05-01 DIAGNOSIS — N6459 Other signs and symptoms in breast: Secondary | ICD-10-CM

## 2023-08-29 ENCOUNTER — Telehealth: Payer: Self-pay | Admitting: *Deleted

## 2023-08-29 ENCOUNTER — Telehealth: Payer: Self-pay

## 2023-08-29 ENCOUNTER — Other Ambulatory Visit (HOSPITAL_COMMUNITY): Payer: Self-pay

## 2023-08-29 NOTE — Telephone Encounter (Signed)
 Pharmacy Patient Advocate Encounter   Received notification from Physician's Office that prior authorization for Mounjaro  15MG /0.5ML auto-injectors is required/requested.   Insurance verification completed.   The patient is insured through [facility] .   Per test claim: PA required; PA submitted to above mentioned insurance via Latent Key/confirmation #/EOC [id number] Status is pending

## 2023-08-29 NOTE — Telephone Encounter (Signed)
 Copied from CRM [id number]. Topic: Clinical - Medication Question >> Aug 29, 2023  8:08 AM Suzen RAMAN wrote: Reason for CRM: Patient is requesting provider to contact insurance to informed them that patient medication(tirzepatide  (MOUNJARO ) 15 MG/0.5ML Pen)  is medical neccessary due to her being diabetic. Patient states there was no mention of a prior authorization.  [phone] Patient not available between 1:00 pm-3:00 pm

## 2023-08-29 NOTE — Telephone Encounter (Signed)
 Please start a PA for Rx Mounjaro 

## 2023-08-29 NOTE — Telephone Encounter (Signed)
 Pharmacy Patient Advocate Encounter  Received notification from [facility] that Prior Authorization for Mounjaro  15MG /0.5ML auto-injectors  has been APPROVED from 08/29/23 to 08/28/24   PA #/Case ID/Reference #: [id number]

## 2023-08-30 ENCOUNTER — Other Ambulatory Visit (HOSPITAL_COMMUNITY): Payer: Self-pay

## 2023-08-30 NOTE — Telephone Encounter (Signed)
 Pt notified Mounjaro  15MG /0.5ML auto-injectors  has been APPROVED from 08/29/23 to 08/28/24

## 2023-09-07 IMAGING — MG MM DIGITAL DIAGNOSTIC UNILAT*R* W/ TOMO W/ CAD
4 series · 4 of 12 positions shown · non-contrast
Comparison: Prior films
COMPARISON: Prior films

Addendum:
CLINICAL DATA: Callback from screening mammogram for possible right
breast skin thickening. Patient with family history with daughter
breast cancer at age 39.

EXAM:
DIGITAL DIAGNOSTIC UNILATERAL RIGHT MAMMOGRAM WITH TOMOSYNTHESIS AND
CAD; ULTRASOUND RIGHT BREAST LIMITED
TECHNIQUE: Right digital diagnostic mammography and breast tomosynthesis was
performed. The images were evaluated with computer-aided detection.;
Targeted ultrasound examination of the right breast was performed

[R MLO synth-2D]
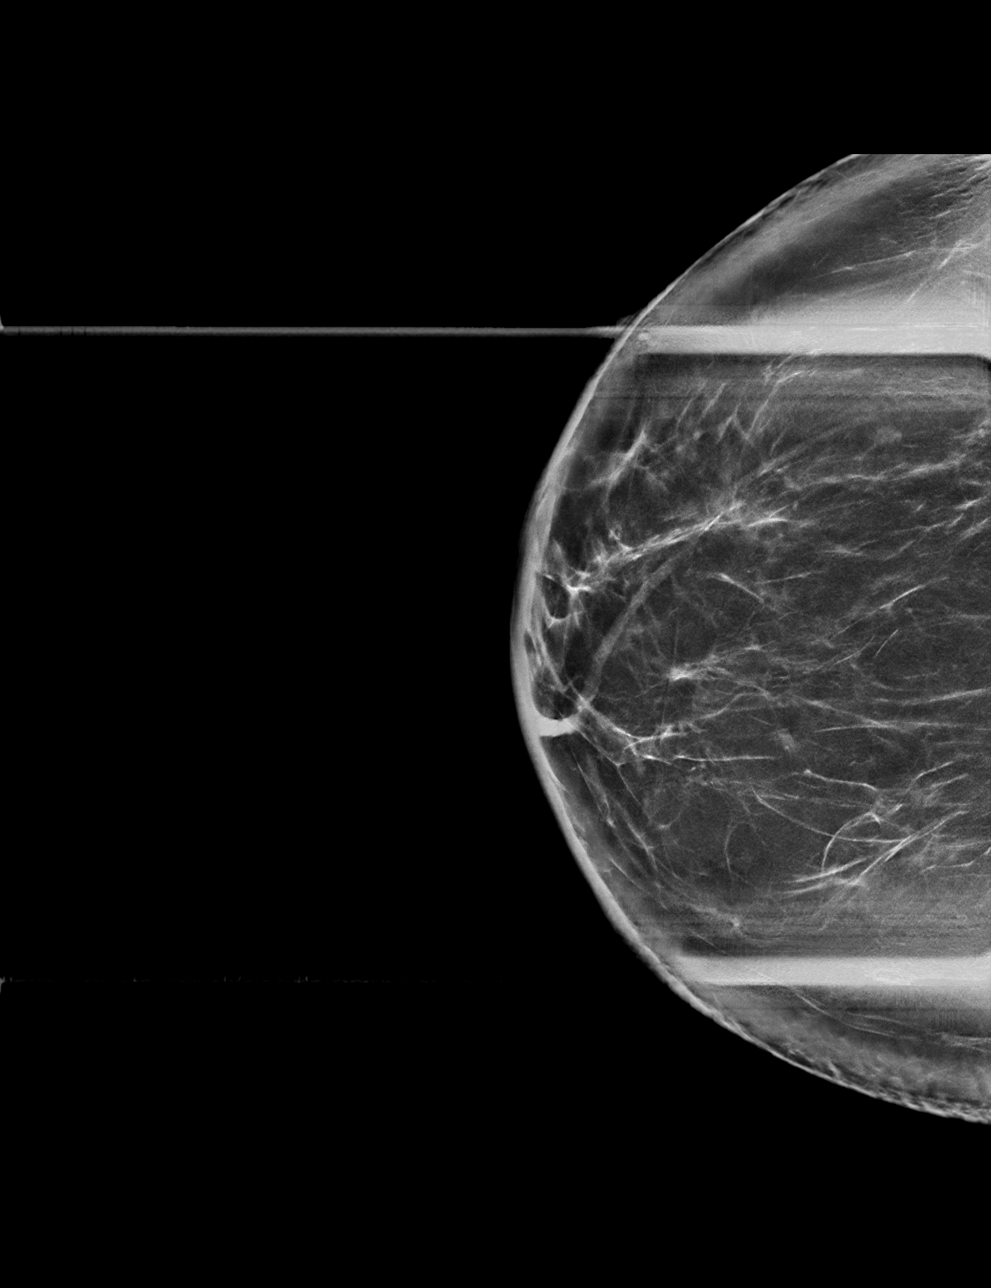

[R CC synth-2D]
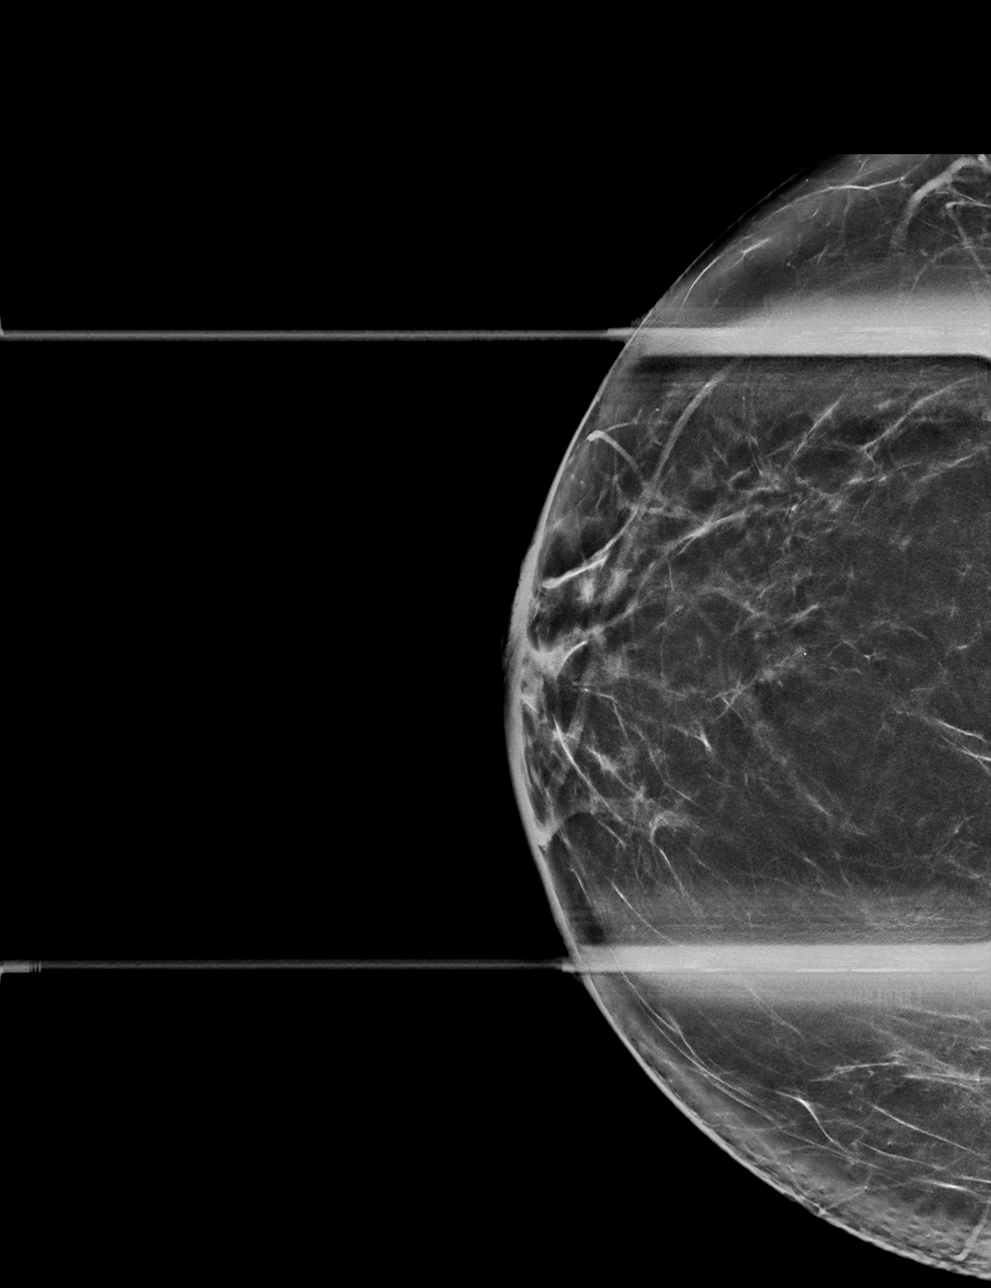

[R CC tomo · tomo slice 38/75.0]
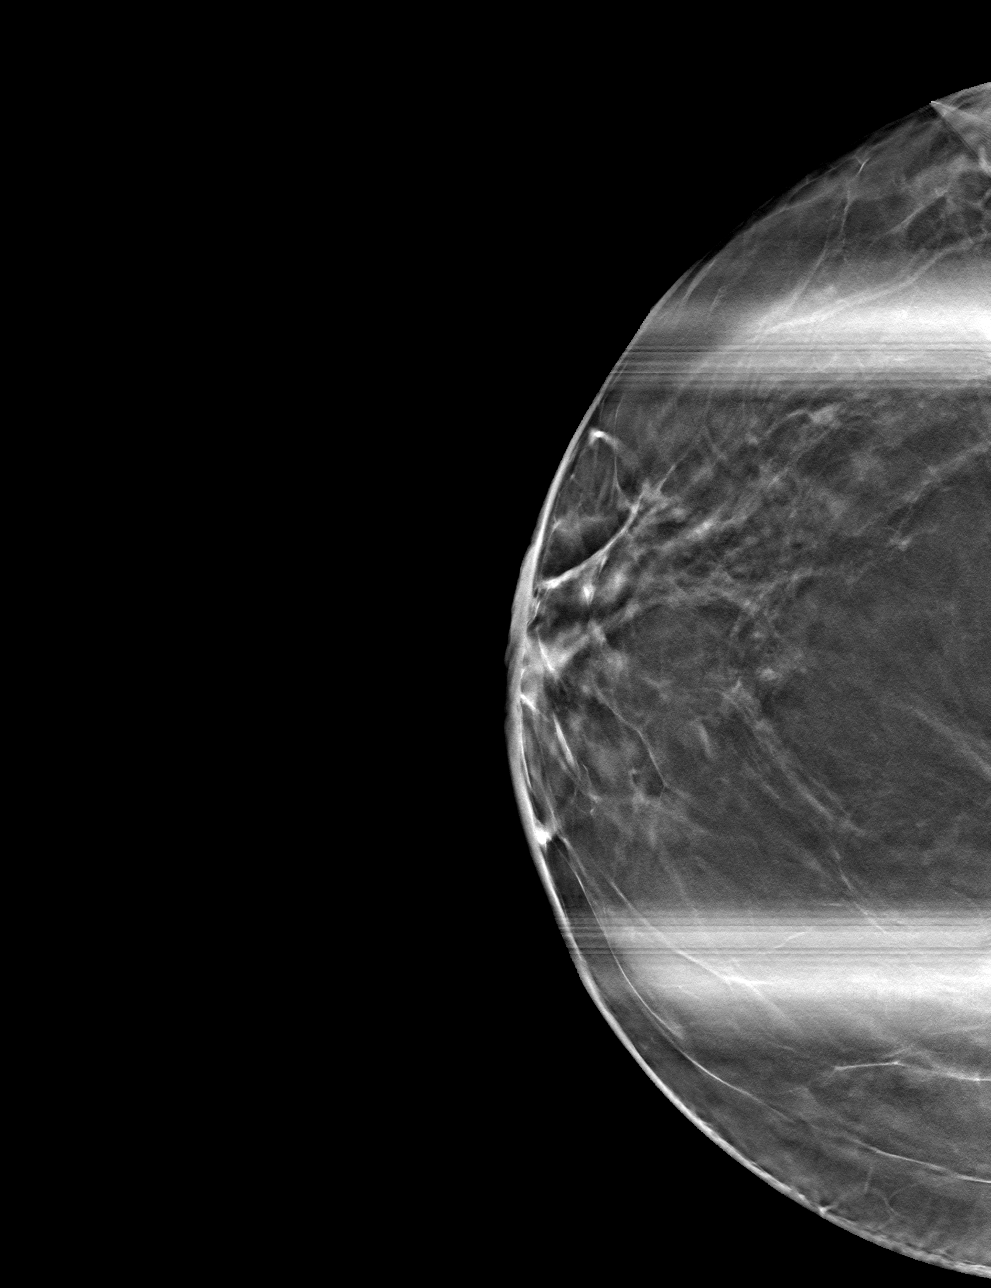

[R MLO tomo · tomo slice 45/89.0]
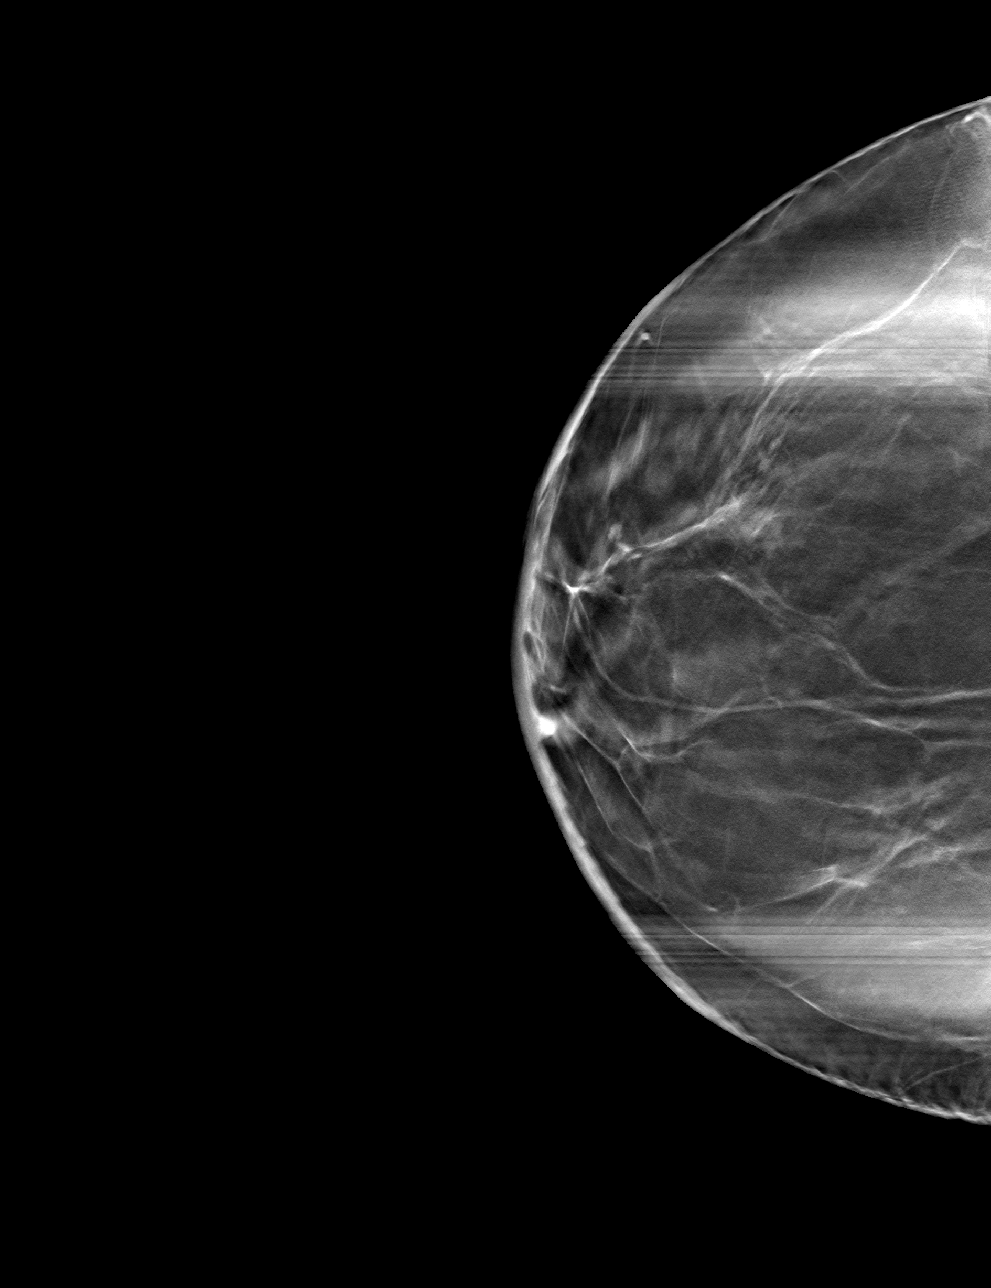

[4 of 12 positions shown; findings below may reference images not displayed]

ACR Breast Density Category b: There are scattered areas of
fibroglandular density.
FINDINGS: Spot compression cc and MLO views of the right breast are submitted.
There is persistent peri areolar skin thickening.

Targeted ultrasound is performed, showing peri areolar right breast
skin thickening measuring 5 mm.
IMPRESSION: Suspicious findings.

RECOMMENDATION:
Recommend surgical consultation for peri areolar right breast skin
thickening.

I have discussed the findings and recommendations with the patient.
If applicable, a reminder letter will be sent to the patient
regarding the next appointment.

BI-RADS CATEGORY  4: Suspicious.

ADDENDUM:
Surgical consultation has been arranged with Dr. Nomasibulele Moatshe at
[REDACTED] on May 30, 2021- per patient request.

Adlerson Sikora RN on 04/27/2021

*** End of Addendum ***
ACR Breast Density Category b: There are scattered areas of
fibroglandular density.
FINDINGS: Spot compression cc and MLO views of the right breast are submitted.
There is persistent peri areolar skin thickening.

Targeted ultrasound is performed, showing peri areolar right breast
skin thickening measuring 5 mm.
IMPRESSION: Suspicious findings.

RECOMMENDATION:
Recommend surgical consultation for peri areolar right breast skin
thickening.

I have discussed the findings and recommendations with the patient.
If applicable, a reminder letter will be sent to the patient
regarding the next appointment.

BI-RADS CATEGORY  4: Suspicious.

## 2023-09-07 IMAGING — US US BREAST*R* LIMITED INC AXILLA
1 series · 12 of 12 positions shown · non-contrast
Comparison: Prior films
COMPARISON: Prior films

Addendum:
CLINICAL DATA: Callback from screening mammogram for possible right
breast skin thickening. Patient with family history with daughter
breast cancer at age 39.

EXAM:
DIGITAL DIAGNOSTIC UNILATERAL RIGHT MAMMOGRAM WITH TOMOSYNTHESIS AND
CAD; ULTRASOUND RIGHT BREAST LIMITED
TECHNIQUE: Right digital diagnostic mammography and breast tomosynthesis was
performed. The images were evaluated with computer-aided detection.;
Targeted ultrasound examination of the right breast was performed

[Series 1: us breast*right* limited inc axilla · 0.08mm/px · 12 of 12 slices shown]
[im 1/12]
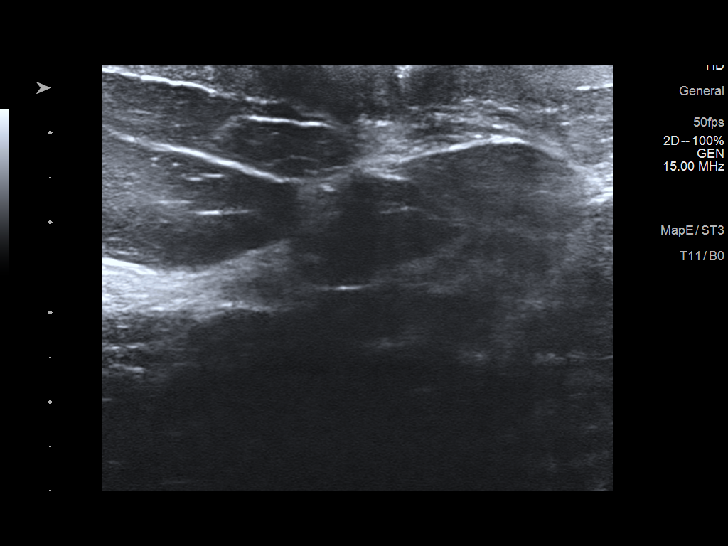
[im 2/12]
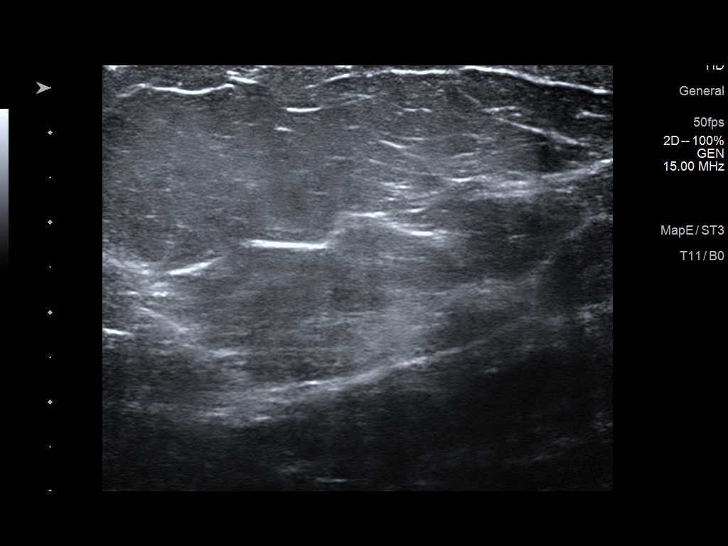
[im 3/12]
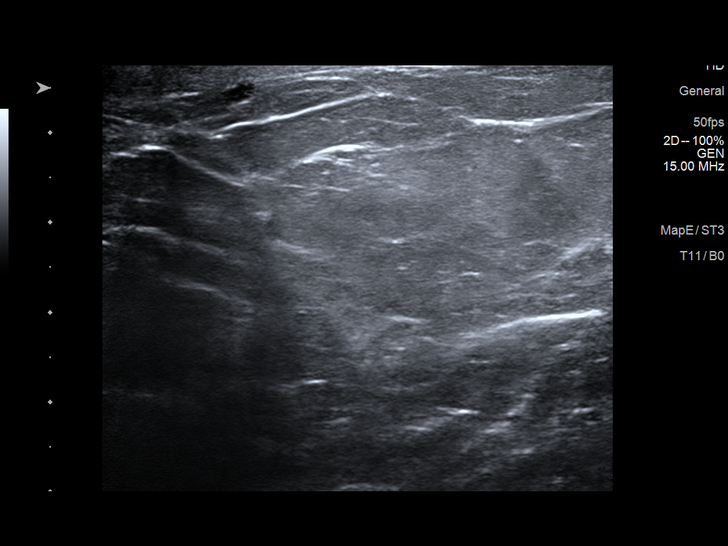
[im 4/12]
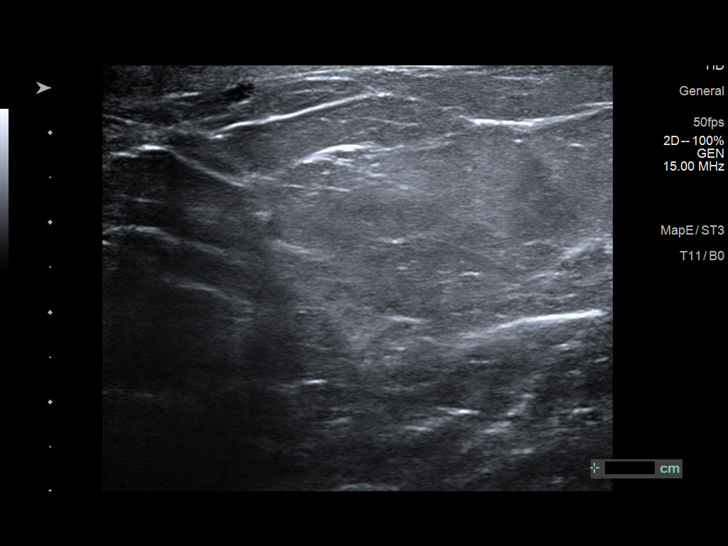
[im 5/12]
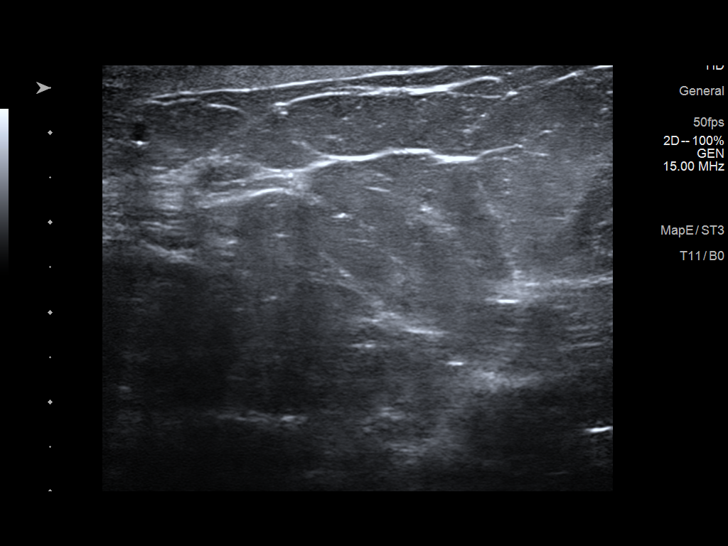
[im 6/12]
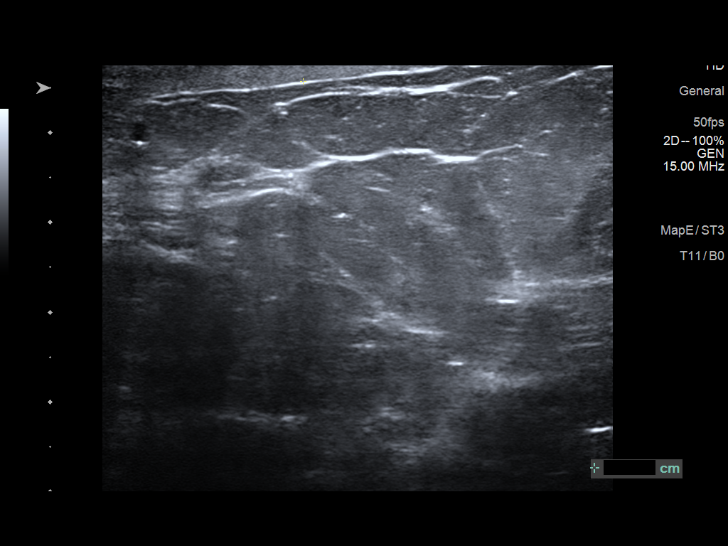
[im 7/12]
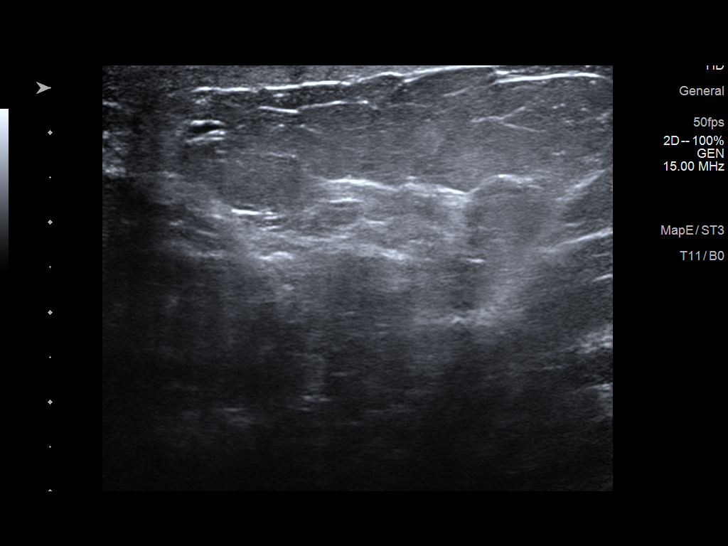
[im 8/12]
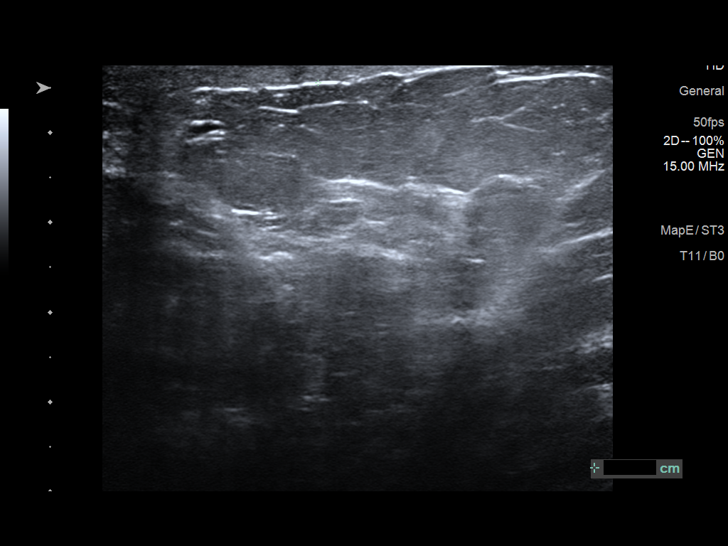
[im 9/12]
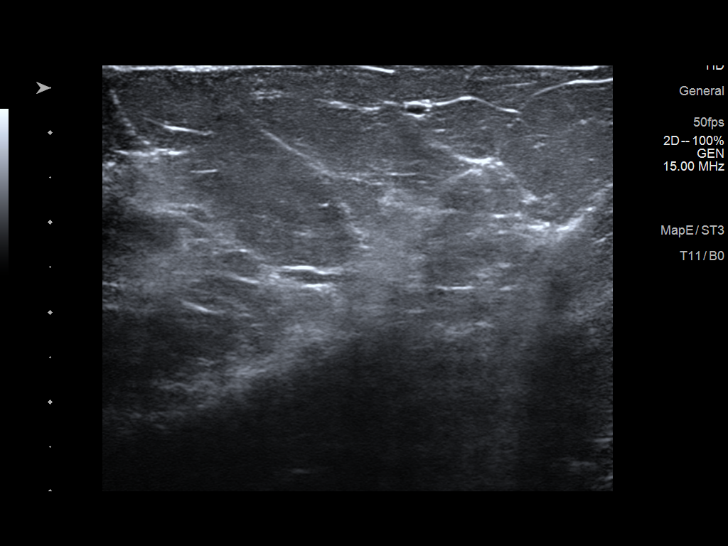
[im 10/12]
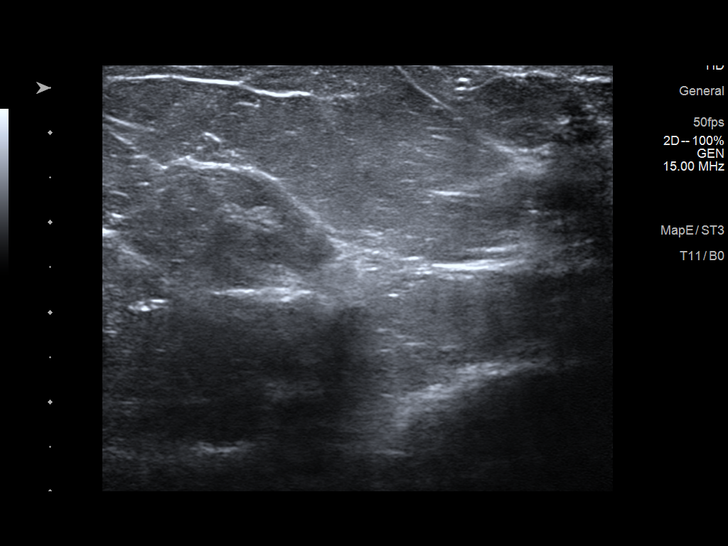
[im 11/12]
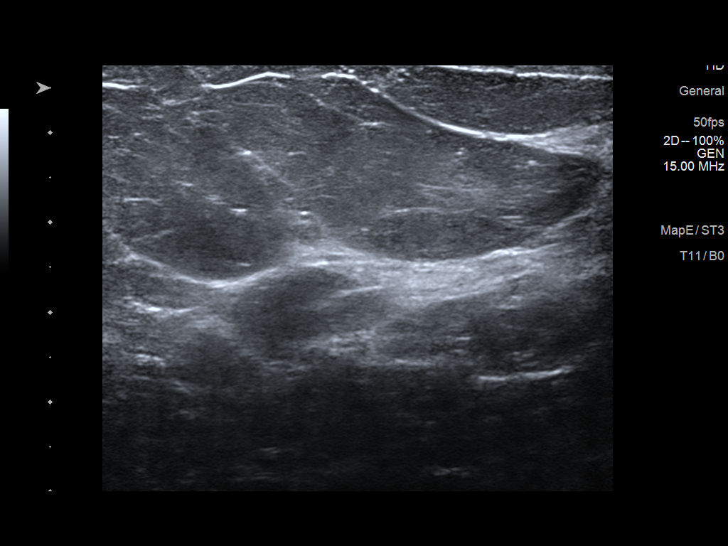
[im 12/12]
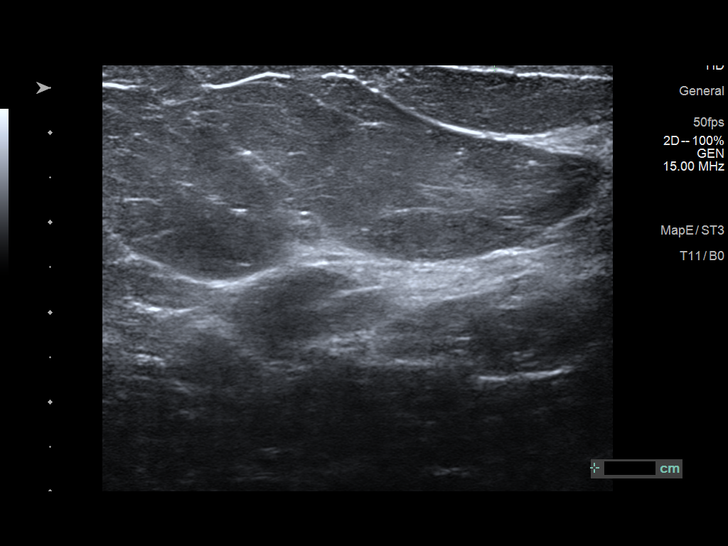

[12 of 12 positions shown; findings below may reference images not displayed]

ACR Breast Density Category b: There are scattered areas of
fibroglandular density.
FINDINGS: Spot compression cc and MLO views of the right breast are submitted.
There is persistent peri areolar skin thickening.

Targeted ultrasound is performed, showing peri areolar right breast
skin thickening measuring 5 mm.
IMPRESSION: Suspicious findings.

RECOMMENDATION:
Recommend surgical consultation for peri areolar right breast skin
thickening.

I have discussed the findings and recommendations with the patient.
If applicable, a reminder letter will be sent to the patient
regarding the next appointment.

BI-RADS CATEGORY  4: Suspicious.

ADDENDUM:
Surgical consultation has been arranged with Dr. Nomasibulele Moatshe at
[REDACTED] on May 30, 2021- per patient request.

Adlerson Sikora RN on 04/27/2021

*** End of Addendum ***
ACR Breast Density Category b: There are scattered areas of
fibroglandular density.
FINDINGS: Spot compression cc and MLO views of the right breast are submitted.
There is persistent peri areolar skin thickening.

Targeted ultrasound is performed, showing peri areolar right breast
skin thickening measuring 5 mm.
IMPRESSION: Suspicious findings.

RECOMMENDATION:
Recommend surgical consultation for peri areolar right breast skin
thickening.

I have discussed the findings and recommendations with the patient.
If applicable, a reminder letter will be sent to the patient
regarding the next appointment.

BI-RADS CATEGORY  4: Suspicious.

## 2023-09-24 ENCOUNTER — Telehealth: Payer: Self-pay | Admitting: *Deleted

## 2023-09-24 NOTE — Telephone Encounter (Signed)
 Copied from CRM [id number]. Topic: Appointments - Appointment Info/Confirmation >> Sep 24, 2023 10:59 AM Rosina BIRCH wrote: Patient/patient representative is calling for information regarding an appointment.   Patient called stating she want to get a covid and flu prescription sent to [facility] pharmacy [facility] [place] [phone]  Spoke with patient, wanted to know if we can send a prescription to her pharmacy for Covid Vaccine  Advise to call pharmacy for appt and information if still needed a rx  Will call us  back with more information  Shayona Hibbitts,RMA

## 2023-10-11 ENCOUNTER — Encounter: Admitting: Family Medicine

## 2023-10-18 ENCOUNTER — Encounter: Payer: Self-pay | Admitting: Family Medicine

## 2023-10-18 ENCOUNTER — Ambulatory Visit: Admitting: Family Medicine

## 2023-10-18 VITALS — BP 132/88 | HR 77 | Temp 97.3°F | Ht 64.8 in | Wt 210.2 lb

## 2023-10-18 DIAGNOSIS — E559 Vitamin D deficiency, unspecified: Secondary | ICD-10-CM | POA: Diagnosis not present

## 2023-10-18 DIAGNOSIS — Z6835 Body mass index (BMI) 35.0-35.9, adult: Secondary | ICD-10-CM

## 2023-10-18 DIAGNOSIS — E1169 Type 2 diabetes mellitus with other specified complication: Secondary | ICD-10-CM | POA: Diagnosis not present

## 2023-10-18 DIAGNOSIS — E785 Hyperlipidemia, unspecified: Secondary | ICD-10-CM

## 2023-10-18 DIAGNOSIS — E669 Obesity, unspecified: Secondary | ICD-10-CM | POA: Diagnosis not present

## 2023-10-18 DIAGNOSIS — E2839 Other primary ovarian failure: Secondary | ICD-10-CM

## 2023-10-18 DIAGNOSIS — Z79899 Other long term (current) drug therapy: Secondary | ICD-10-CM | POA: Diagnosis not present

## 2023-10-18 DIAGNOSIS — E119 Type 2 diabetes mellitus without complications: Secondary | ICD-10-CM

## 2023-10-18 DIAGNOSIS — Z7985 Long-term (current) use of injectable non-insulin antidiabetic drugs: Secondary | ICD-10-CM

## 2023-10-18 LAB — CBC
HCT: 44 % (ref 36.0–46.0)
Hemoglobin: 14.5 g/dL (ref 12.0–15.0)
MCHC: 33 g/dL (ref 30.0–36.0)
MCV: 91.6 fl (ref 78.0–100.0)
Platelets: 245 K/uL (ref 150.0–400.0)
RBC: 4.8 Mil/uL (ref 3.87–5.11)
RDW: 13.6 % (ref 11.5–15.5)
WBC: 6.6 K/uL (ref 4.0–10.5)

## 2023-10-18 LAB — COMPREHENSIVE METABOLIC PANEL WITH GFR
ALT: 17 U/L (ref 0–35)
AST: 16 U/L (ref 0–37)
Albumin: 4.1 g/dL (ref 3.5–5.2)
Alkaline Phosphatase: 68 U/L (ref 39–117)
BUN: 10 mg/dL (ref 6–23)
CO2: 28 meq/L (ref 19–32)
Calcium: 9.2 mg/dL (ref 8.4–10.5)
Chloride: 106 meq/L (ref 96–112)
Creatinine, Ser: 0.77 mg/dL (ref 0.40–1.20)
GFR: 79.31 mL/min (ref >60.00)
Glucose, Bld: 109 mg/dL — ABNORMAL HIGH (ref 70–99)
Potassium: 4.1 meq/L (ref 3.5–5.1)
Sodium: 141 meq/L (ref 135–145)
Total Bilirubin: 0.4 mg/dL (ref 0.2–1.2)
Total Protein: 6.8 g/dL (ref 6.0–8.3)

## 2023-10-18 LAB — LIPID PANEL
Cholesterol: 250 mg/dL — ABNORMAL HIGH (ref 0–200)
HDL: 48.7 mg/dL (ref >39.00)
LDL Cholesterol: 169 mg/dL — ABNORMAL HIGH (ref 0–99)
NonHDL: 201.67
Total CHOL/HDL Ratio: 5
Triglycerides: 165 mg/dL — ABNORMAL HIGH (ref 0.0–149.0)
VLDL: 33 mg/dL (ref 0.0–40.0)

## 2023-10-18 LAB — POCT GLYCOSYLATED HEMOGLOBIN (HGB A1C): Hemoglobin A1C: 5.5 % (ref 4.0–5.6)

## 2023-10-18 LAB — TSH: TSH: 1.25 u[IU]/mL (ref 0.35–5.50)

## 2023-10-18 LAB — VITAMIN B12: Vitamin B-12: 834 pg/mL (ref 211–911)

## 2023-10-18 LAB — VITAMIN D 25 HYDROXY (VIT D DEFICIENCY, FRACTURES): VITD: 26.68 ng/mL — ABNORMAL LOW (ref 30.00–100.00)

## 2023-10-18 MED ORDER — TIRZEPATIDE 15 MG/0.5ML ~~LOC~~ SOAJ: 15.0000 mg | SUBCUTANEOUS | 4 refills | Status: AC

## 2023-10-18 NOTE — Assessment & Plan Note (Signed)
 A1c very well-controlled at 5.5.  She is doing well with Mounjaro  15 mg weekly.  She is also working lifestyle modifications.  Recheck A1c in 6 months.

## 2023-10-18 NOTE — Assessment & Plan Note (Signed)
Check vitamin D with labs.

## 2023-10-18 NOTE — Assessment & Plan Note (Signed)
 Check lipids

## 2023-10-18 NOTE — Progress Notes (Signed)
   Tara [name] is a 68 y.o. female who presents today for an office visit.  Assessment/Plan:  New/Acute Problems: Right hip pain Location of pain would be consistent with trochanteric bursitis however patient states that her pain usually is alleviated with eating which is atypical for this.  We did discuss further evaluation with imaging however she would like to hold off on this for now.  Will check labs today.  She will let us  know if pain persist and would consider imaging versus referral at that time.  Chronic Problems Addressed Today: Controlled type 2 diabetes mellitus without complication, without long-term current use of insulin (HCC) A1c very well-controlled at 5.5.  She is doing well with Mounjaro  15 mg weekly.  She is also working lifestyle modifications.  Recheck A1c in 6 months.  Dyslipidemia due to type 2 diabetes mellitus (HCC) Check lipids  Vitamin D  deficiency Check vitamin D  with labs.  Preventative health care-will order bone density scan today.  She will be getting her vaccines next week.    Subjective:  HPI:  See assessment / plan for status of chronic conditions.   Discussed the use of AI scribe software for clinical note transcription with the patient, who gave verbal consent to proceed.  History of Present Illness Tara [name] is a 68 year old female who presents for a six-month follow-up visit.  She experiences intermittent joint pain, primarily in her right hip, which has been occurring for approximately six months. The pain improves after eating, although no specific food triggers have been identified. It occurs less frequently with regular exercise.  She has diabetes and is taking Mounjaro  15 mg weekly, with a recent A1c of 5.5%. She does not report any side effects from Mounjaro . She consumes at least one good protein meal a day and avoids eating past 6 PM to help manage her acid reflux.  She has a history of vitamin D  deficiency  and takes vitamin D  supplements daily. She is cautious with spicy foods due to acid reflux.  She recalls a previous arrangement for bone density testing which was canceled due to changes at the facility. She is seeking a referral to another facility for this test.         Objective:  Physical Exam: BP 132/88   Pulse 77   Temp (!) 97.3 F (36.3 C) (Temporal)   Ht 5' 4.8 (1.646 m)   Wt 210 lb 3.2 oz (95.3 kg)   SpO2 97%   BMI 35.20 kg/m   Wt Readings from Last 3 Encounters:  10/18/23 210 lb 3.2 oz (95.3 kg)  03/29/23 206 lb 8 oz (93.7 kg)  01/25/23 200 lb (90.7 kg)  Gen: No acute distress, resting comfortably CV: Regular rate and rhythm with no murmurs appreciated Pulm: Normal work of breathing, clear to auscultation bilaterally with no crackles, wheezes, or rhonchi MUSCULOSKELETAL - Legs: No deformities.  Full range of motion throughout.  Neurovascular intact distally. Neuro: Grossly normal, moves all extremities Psych: Normal affect and thought content      Sascha Palma M. Kennyth, MD 10/18/2023 9:32 AM

## 2023-10-18 NOTE — Patient Instructions (Signed)
 It was very nice to see you today!  VISIT SUMMARY: Today, you had a follow-up visit to discuss your diabetes, joint pain, acid reflux, and vitamin D  deficiency. We also reviewed your general health maintenance and scheduled necessary tests and vaccinations.  YOUR PLAN: TYPE 2 DIABETES MELLITUS: Your diabetes is well controlled with an A1c of 5.5%. -Continue taking Mounjaro  15 mg weekly. -Your prescription for Mounjaro  has been renewed.  RIGHT HIP PAIN: You have been experiencing intermittent pain in your right hip for six months. -Consider getting an x-ray if the pain persists or worsens.  VITAMIN D  DEFICIENCY: You are taking daily vitamin D  supplements. -Blood work has been ordered to check your vitamin D  levels.  GENERAL HEALTH MAINTENANCE: We reviewed your routine health maintenance and scheduled necessary tests and vaccinations. -You are scheduled for flu and COVID booster vaccines. -Blood work has been ordered to check liver function, kidney function, thyroid , electrolytes, and B12 levels. -A referral has been made for a bone density scan at [facility].  Return in about 6 months (around 04/17/2024) for Follow Up.   Take care, Dr Kennyth  PLEASE NOTE:  If you had any lab tests, please let us  know if you have not heard back within a few days. You may see your results on mychart before we have a chance to review them but we will give you a call once they are reviewed by us .   If we ordered any referrals today, please let us  know if you have not heard from their office within the next week.   If you had any urgent prescriptions sent in today, please check with the pharmacy within an hour of our visit to make sure the prescription was transmitted appropriately.   Please try these tips to maintain a healthy lifestyle:  Eat at least 3 REAL meals and 1-2 snacks per day.  Aim for no more than 5 hours between eating.  If you eat breakfast, please do so within one hour of getting  up.   Each meal should contain half fruits/vegetables, one quarter protein, and one quarter carbs (no bigger than a computer mouse)  Cut down on sweet beverages. This includes juice, soda, and sweet tea.   Drink at least 1 glass of water with each meal and aim for at least 8 glasses per day  Exercise at least 150 minutes every week.

## 2023-10-22 ENCOUNTER — Encounter: Payer: Self-pay | Admitting: Family Medicine

## 2023-10-22 ENCOUNTER — Ambulatory Visit: Payer: Self-pay | Admitting: Family Medicine

## 2023-10-22 DIAGNOSIS — M8580 Other specified disorders of bone density and structure, unspecified site: Secondary | ICD-10-CM

## 2023-10-22 NOTE — Progress Notes (Signed)
 Cholesterol is elevated.  I know we have discussed statins in the past however I do believe this would be a good idea to improve her numbers and low risk of heart attack and stroke.  Please send in simvastatin 40 mg daily if she is agreeable to start.  Regardless, she should continue to work on diet and exercise and we should recheck this in 6 to 12 months.  Vitamin D  is low but similar to last year.  Recommend she continue supplementation 5000 IUs daily if she is not doing so already.  The rest of her labs are all at goal and we can recheck everything in a year.

## 2023-11-29 ENCOUNTER — Other Ambulatory Visit

## 2023-12-13 ENCOUNTER — Inpatient Hospital Stay
Admission: RE | Admit: 2023-12-13 | Discharge: 2023-12-13 | Disposition: A | Source: Ambulatory Visit | Attending: Family Medicine | Admitting: Family Medicine

## 2023-12-13 DIAGNOSIS — E2839 Other primary ovarian failure: Secondary | ICD-10-CM

## 2023-12-13 DIAGNOSIS — E119 Type 2 diabetes mellitus without complications: Secondary | ICD-10-CM

## 2023-12-14 DIAGNOSIS — M8580 Other specified disorders of bone density and structure, unspecified site: Secondary | ICD-10-CM | POA: Insufficient documentation

## 2023-12-14 NOTE — Progress Notes (Signed)
 Her bone density scan shows that she has mild thinning of the bones called osteopenia.  This is a precursor to osteoporosis.  She does not need to start any medications for this however it is important that she optimize her calcium  and vitamin D  intake to prevent progression to osteoporosis.  The recommendation is for her to take at least 1200 mg a day of calcium  and 800 IUs daily of vitamin D .  Is also important that she work on weightbearing exercises as tolerated.  We should repeat this again in 2 years.

## 2023-12-20 LAB — OPHTHALMOLOGY REPORT-SCANNED

## 2024-01-01 ENCOUNTER — Other Ambulatory Visit (HOSPITAL_BASED_OUTPATIENT_CLINIC_OR_DEPARTMENT_OTHER)

## 2024-01-30 ENCOUNTER — Ambulatory Visit: Payer: Medicare Other

## 2024-01-30 VITALS — Ht 64.0 in | Wt 200.0 lb

## 2024-01-30 DIAGNOSIS — Z0000 Encounter for general adult medical examination without abnormal findings: Secondary | ICD-10-CM | POA: Diagnosis not present

## 2024-01-30 NOTE — Progress Notes (Signed)
 "  Chief Complaint  Patient presents with   Medicare Wellness     Subjective:   Tara [name] is a 69 y.o. female who presents for a Medicare Annual Wellness Visit.  Visit info / Clinical Intake: Medicare Wellness Visit Type:: Subsequent Annual Wellness Visit Persons participating in visit and providing information:: patient Medicare Wellness Visit Mode:: Video Since this visit was completed virtually, some vitals may be partially provided or unavailable. Missing vitals are due to the limitations of the virtual format.: Unable to obtain vitals - no equipment If Telephone or Video please confirm:: I connected with patient using audio/video enable telemedicine. I verified patient identity with two identifiers, discussed telehealth limitations, and patient agreed to proceed. Patient Location:: home Provider Location:: office Interpreter Needed?: No Pre-visit prep was completed: yes AWV questionnaire completed by patient prior to visit?: yes Date:: 01/27/24 Living arrangements:: (Patient-Rptd) with family/others Patient's Overall Health Status Rating: (Patient-Rptd) good Typical amount of pain: (Patient-Rptd) some Does pain affect daily life?: (Patient-Rptd) no Are you currently prescribed opioids?: no  Dietary Habits and Nutritional Risks How many meals a day?: (Patient-Rptd) 2 Eats fruit and vegetables daily?: (Patient-Rptd) yes Most meals are obtained by: (Patient-Rptd) preparing own meals In the last 2 weeks, have you had any of the following?: none Diabetic:: (!) yes Any non-healing wounds?: no How often do you check your BS?: 0 Would you like to be referred to a Nutritionist or for Diabetic Management? : no  Functional Status Activities of Daily Living (to include ambulation/medication): (Patient-Rptd) Independent Ambulation: Independent with device- listed below Home Assistive Devices/Equipment: Eyeglasses Medication Administration: (Patient-Rptd) Independent Home  Management (perform basic housework or laundry): (Patient-Rptd) Independent Manage your own finances?: (Patient-Rptd) yes Primary transportation is: (Patient-Rptd) driving Concerns about vision?: no *vision screening is required for WTM* Concerns about hearing?: (!) yes (slight HOH) Uses hearing aids?: no Hear whispered voice?: yes  Fall Screening Falls in the past year?: (Patient-Rptd) 0 Number of falls in past year: 0 Was there an injury with Fall?: 0 Fall Risk Category Calculator: 0 Patient Fall Risk Level: Low Fall Risk  Fall Risk Patient at Risk for Falls Due to: No Fall Risks Fall risk Follow up: Falls evaluation completed  Home and Transportation Safety: All rugs have non-skid backing?: (!) (Patient-Rptd) no All stairs or steps have railings?: (Patient-Rptd) yes Grab bars in the bathtub or shower?: (Patient-Rptd) yes Have non-skid surface in bathtub or shower?: (!) (Patient-Rptd) no Good home lighting?: (Patient-Rptd) yes Regular seat belt use?: (Patient-Rptd) yes Hospital stays in the last year:: (Patient-Rptd) no  Cognitive Assessment Difficulty concentrating, remembering, or making decisions? : (Patient-Rptd) no Will 6CIT or Mini Cog be Completed: yes What year is it?: 0 points What month is it?: 0 points Give patient an address phrase to remember (5 components): [address] [place]  About what time is it?: 0 points Count backwards from 20 to 1: 0 points Say the months of the year in reverse: 0 points Repeat the address phrase from earlier: 0 points 6 CIT Score: 0 points  Advance Directives (For Healthcare) Does Patient Have a Medical Advance Directive?: Yes Type of Advance Directive: Healthcare Power of Attorney Copy of Healthcare Power of Attorney in Chart?: No - copy requested  Reviewed/Updated  Reviewed/Updated: Reviewed All (Medical, Surgical, Family, Medications, Allergies, Care Teams, Patient Goals)    Allergies (verified) Cortisone   Current  Medications (verified) Outpatient Encounter Medications as of 01/30/2024  Medication Sig   azelaic acid (AZELEX) 20 % cream Apply  topically 2 (two) times daily. After skin is thoroughly washed and patted dry, gently but thoroughly massage a thin film of azelaic acid cream into the affected area twice daily, in the morning and evening.   b complex vitamins tablet Take 1 tablet by mouth daily.   Bisacodyl (GENTLE LAXATIVE PO) Take 1 tablet by mouth daily at 6 (six) AM.   Calcium  Carb-Cholecalciferol (CALCIUM  500+D PO) Take by mouth.   Cholecalciferol (VITAMIN D3) 50 MCG (2000 UT) TABS Take 1 tablet by mouth daily at 6 (six) AM. SOFT GELcap   clindamycin (CLINDAGEL) 1 % gel clindamycin 1 % topical gel  APPLY THIN COAT TO AFFECTED AREA TWICE A DAY   famotidine-calcium  carbonate-magnesium hydroxide (PEPCID COMPLETE) 10-800-165 MG chewable tablet Chew 1 tablet by mouth daily as needed.   Magnesium 200 MG TABS Take 2 tablets by mouth daily at 6 (six) AM.   Omega-3 1000 MG CAPS Take 1 capsule by mouth daily. GEL cap   tirzepatide  (MOUNJARO ) 15 MG/0.5ML Pen Inject 15 mg into the skin once a week.   TURMERIC PO Take 1 capsule by mouth daily at 6 (six) AM.   Facility-Administered Encounter Medications as of 01/30/2024  Medication   0.9 %  sodium chloride  infusion    History: Past Medical History:  Diagnosis Date   Cataract    bilateral-not a surgical candidate at this time (05/04/2022)   Cervical spine tumor    Chest pain 11/09/2005   Neg eval by cath   Costochondritis    Diabetes mellitus without complication (HCC)    on meds   Heart murmur    Birth Defect   PE (pulmonary embolism) 01/09/2005   Small   Reactive depression (situational)    Seizures (HCC) 1992   last known seizure 1992   Past Surgical History:  Procedure Laterality Date   ABDOMINAL HYSTERECTOMY  2007   CESAREAN SECTION  1982   REMOVAL OF BENIGN TUMOR - CERVICAL SPINE     Surgical Repair for Displaced Rod Fixation  2003    Family History  Problem Relation Age of Onset   Miscarriages / Stillbirths Father    Hypertension Father    Hyperlipidemia Father    Coronary artery disease Father    Diabetes Father    Cancer Sister    Depression Sister    Heart attack Brother    Depression Brother    Diabetes Brother    Hypertension Brother    Kidney disease Brother    Diabetes Brother        Severe complication including renal failure, amputation   Drug abuse Brother    Heart disease Brother    Heart attack Brother    Depression Brother    Diabetes Brother    Hyperlipidemia Brother    Colon cancer Paternal Aunt    Breast cancer Paternal Aunt        71s   Breast cancer Daughter 34   Kidney failure Daughter    Cancer Daughter    Colon polyps Neg Hx    Esophageal cancer Neg Hx    Stomach cancer Neg Hx    Rectal cancer Neg Hx    Social History   Occupational History   Occupation: Chemical Engineer: [employer]  Tobacco Use   Smoking status: Never   Smokeless tobacco: Never  Vaping Use   Vaping status: Never Used  Substance and Sexual Activity   Alcohol use: Not Currently   Drug use: Never  Sexual activity: Not Currently   Tobacco Counseling Counseling given: Not Answered  SDOH Screenings   Food Insecurity: Food Insecurity Present (01/27/2024)  Housing: Low Risk (01/27/2024)  Transportation Needs: No Transportation Needs (01/27/2024)  Utilities: Not At Risk (01/30/2024)  Alcohol Screen: Low Risk (01/27/2024)  Depression (PHQ2-9): Low Risk (01/30/2024)  Financial Resource Strain: Medium Risk (01/27/2024)  Physical Activity: Unknown (01/27/2024)  Social Connections: Moderately Integrated (01/27/2024)  Stress: No Stress Concern Present (01/27/2024)  Tobacco Use: Low Risk (01/30/2024)  Health Literacy: Adequate Health Literacy (01/30/2024)   See flowsheets for full screening details  Depression Screen PHQ 2 & 9 Depression Scale- Over the past 2 weeks, how often have you been  bothered by any of the following problems? Little interest or pleasure in doing things: 0 Feeling down, depressed, or hopeless (PHQ Adolescent also includes...irritable): 0 PHQ-2 Total Score: 0     Goals Addressed               This Visit's Progress     walking more for exercise (pt-stated)        Walking more for exercise and maintain low blood sugar              Objective:    Today's Vitals   01/30/24 1051  Weight: 200 lb (90.7 kg)  Height: 5' 4 (1.626 m)   Body mass index is 34.33 kg/m.  Hearing/Vision screen Hearing Screening - Comments:: Pt stated Slight HOH  Vision Screening - Comments:: Wears rx glasses - up to date with routine eye exams [facility]  Immunizations and Health Maintenance Health Maintenance  Topic Date Due   FOOT EXAM  Never done   OPHTHALMOLOGY EXAM  12/18/2023   DTaP/Tdap/Td (2 - Td or Tdap) 02/25/2024   Diabetic kidney evaluation - Urine ACR  03/28/2024   HEMOGLOBIN A1C  04/17/2024   COVID-19 Vaccine (8 - 2025-26 season) 04/21/2024   Mammogram  04/30/2024   Diabetic kidney evaluation - eGFR measurement  10/17/2024   Medicare Annual Wellness (AWV)  01/29/2025   Bone Density Scan  12/12/2025   Colonoscopy  05/22/2029   Influenza Vaccine  Completed   Hepatitis C Screening  Completed   Zoster Vaccines- Shingrix  Completed   Meningococcal B Vaccine  Aged Out   Pneumococcal Vaccine: 50+ Years  Discontinued        Assessment/Plan:  This is a routine wellness examination for [facility].  Patient Care Team: Kennyth Worth HERO, MD as PCP - General (Family Medicine) Debarah Lorrene DEL., MD (Ophthalmology)  I have personally reviewed and noted the following in the patients chart:   Medical and social history Use of alcohol, tobacco or illicit drugs  Current medications and supplements including opioid prescriptions. Functional ability and status Nutritional status Physical activity Advanced directives List of other  physicians Hospitalizations, surgeries, and ER visits in previous 12 months Vitals Screenings to include cognitive, depression, and falls Referrals and appointments  No orders of the defined types were placed in this encounter.  In addition, I have reviewed and discussed with patient certain preventive protocols, quality metrics, and best practice recommendations. A written personalized care plan for preventive services as well as general preventive health recommendations were provided to patient.   Ellouise DEL Haws, LPN   8/78/7973   Return in about 1 year (around 02/02/2025).  After Visit Summary: (MyChart) Due to this being a telephonic visit, the after visit summary with patients personalized plan was offered to patient via MyChart  Nurse Notes: HM Addressed: Diabetic Foot Exam recommended Requested records of eye exams   "

## 2024-01-30 NOTE — Patient Instructions (Signed)
 Tara [name],  Thank you for taking the time for your Medicare Wellness Visit. I appreciate your continued commitment to your health goals. Please review the care plan we discussed, and feel free to reach out if I can assist you further.  Please note that Annual Wellness Visits do not include a physical exam. Some assessments may be limited, especially if the visit was conducted virtually. If needed, we may recommend an in-person follow-up with your provider.  Ongoing Care Seeing your primary care provider every 3 to 6 months helps us  monitor your health and provide consistent, personalized care.   Referrals If a referral was made during today's visit and you haven't received any updates within two weeks, please contact the referred provider directly to check on the status.  Recommended Screenings:  Health Maintenance  Topic Date Due   Complete foot exam   Never done   Eye exam for diabetics  12/18/2023   DTaP/Tdap/Td vaccine (2 - Td or Tdap) 02/25/2024   Kidney health urinalysis for diabetes  03/28/2024   Hemoglobin A1C  04/17/2024   COVID-19 Vaccine (8 - 2025-26 season) 04/21/2024   Breast Cancer Screening  04/30/2024   Yearly kidney function blood test for diabetes  10/17/2024   Medicare Annual Wellness Visit  01/29/2025   Osteoporosis screening with Bone Density Scan  12/12/2025   Colon Cancer Screening  05/22/2029   Flu Shot  Completed   Hepatitis C Screening  Completed   Zoster (Shingles) Vaccine  Completed   Meningitis B Vaccine  Aged Out   Pneumococcal Vaccine for age over 30  Discontinued       01/25/2023   11:28 AM  Advanced Directives  Does Patient Have a Medical Advance Directive? Yes  Type of Estate Agent of [place];Living will  Copy of Healthcare Power of Attorney in Chart? No - copy requested    Vision: Annual vision screenings are recommended for early detection of glaucoma, cataracts, and diabetic retinopathy. These exams can also  reveal signs of chronic conditions such as diabetes and high blood pressure.  Dental: Annual dental screenings help detect early signs of oral cancer, gum disease, and other conditions linked to overall health, including heart disease and diabetes.  Please see the attached documents for additional preventive care recommendations.

## 2024-04-22 ENCOUNTER — Ambulatory Visit: Admitting: Family Medicine

## 2025-02-03 ENCOUNTER — Ambulatory Visit
